# Patient Record
Sex: Female | Born: 1955 | Race: White | Hispanic: No | Smoking: Never smoker
Health system: Southern US, Community
[De-identification: ages and names within clinical notes are randomized; demographics above are authoritative.]

## PROBLEM LIST (undated history)

## (undated) DIAGNOSIS — M1990 Unspecified osteoarthritis, unspecified site: Secondary | ICD-10-CM

## (undated) DIAGNOSIS — T7840XA Allergy, unspecified, initial encounter: Secondary | ICD-10-CM

## (undated) DIAGNOSIS — I10 Essential (primary) hypertension: Secondary | ICD-10-CM

## (undated) HISTORY — PX: ROTATOR CUFF REPAIR: SHX139

## (undated) HISTORY — PX: BACK SURGERY: SHX140

## (undated) HISTORY — DX: Allergy, unspecified, initial encounter: T78.40XA

---

## 2000-08-17 ENCOUNTER — Encounter: Admission: RE | Admit: 2000-08-17 | Discharge: 2000-08-17 | Payer: Self-pay | Admitting: Family Medicine

## 2000-08-17 ENCOUNTER — Encounter: Payer: Self-pay | Admitting: Family Medicine

## 2000-08-30 ENCOUNTER — Encounter: Payer: Self-pay | Admitting: Neurosurgery

## 2000-09-01 ENCOUNTER — Encounter: Payer: Self-pay | Admitting: Neurosurgery

## 2000-09-01 ENCOUNTER — Inpatient Hospital Stay (HOSPITAL_COMMUNITY): Admission: RE | Admit: 2000-09-01 | Discharge: 2000-09-03 | Payer: Self-pay | Admitting: Neurosurgery

## 2000-09-22 ENCOUNTER — Encounter: Payer: Self-pay | Admitting: Neurosurgery

## 2000-09-22 ENCOUNTER — Encounter: Admission: RE | Admit: 2000-09-22 | Discharge: 2000-09-22 | Payer: Self-pay | Admitting: Neurosurgery

## 2000-10-23 ENCOUNTER — Encounter: Payer: Self-pay | Admitting: Neurosurgery

## 2000-10-23 ENCOUNTER — Encounter: Admission: RE | Admit: 2000-10-23 | Discharge: 2000-10-23 | Payer: Self-pay | Admitting: Neurosurgery

## 2001-05-01 ENCOUNTER — Encounter: Admission: RE | Admit: 2001-05-01 | Discharge: 2001-05-01 | Payer: Self-pay | Admitting: Neurosurgery

## 2001-05-01 ENCOUNTER — Encounter: Payer: Self-pay | Admitting: Neurosurgery

## 2003-06-24 ENCOUNTER — Encounter: Payer: Self-pay | Admitting: Neurosurgery

## 2003-06-24 ENCOUNTER — Encounter: Admission: RE | Admit: 2003-06-24 | Discharge: 2003-06-24 | Payer: Self-pay | Admitting: Neurosurgery

## 2003-07-30 ENCOUNTER — Encounter: Payer: Self-pay | Admitting: Neurosurgery

## 2003-08-01 ENCOUNTER — Encounter: Payer: Self-pay | Admitting: Neurosurgery

## 2003-08-01 ENCOUNTER — Inpatient Hospital Stay (HOSPITAL_COMMUNITY): Admission: RE | Admit: 2003-08-01 | Discharge: 2003-08-03 | Payer: Self-pay | Admitting: Neurosurgery

## 2004-02-23 ENCOUNTER — Ambulatory Visit (HOSPITAL_COMMUNITY): Admission: RE | Admit: 2004-02-23 | Discharge: 2004-02-23 | Payer: Self-pay | Admitting: Chiropractic Medicine

## 2004-07-17 IMAGING — CR DG CERVICAL SPINE COMPLETE 4+V
6 series · 6 of 6 positions shown · non-contrast
Comparison: none

CLINICAL DATA: right neck pain, back pain, prior back surgery
 LUMBAR SPINE FOUR VIEWS
 Five non-rib-bearing lumbar vertebrae.  Disc space narrowing L4-5.  Minimal endplate spur formation and scattered endplates.  No fracture or subluxation.  No bone destruction or spondylolysis seen.  Left assimilation joint L5-S1.  Hypoplastic last rib.
 IMPRESSION
 Minimal degenerative changes.  No evidence of acute injury.
 CERVICAL SPINE FIVE VIEWS
 Prior cervical fusion with anterior plate-and-screws extending from C5 to C7.  No fracture or subluxation.  Prevertebral soft tissues normal thickness.  Anterior spur formation C4-5.  Bony foramina patent.  C1-2 alignment normal.
 Prior C5-7 fusion.  Minimal degenerative changes without acute abnormality.

[view not recorded (1 of 6)]
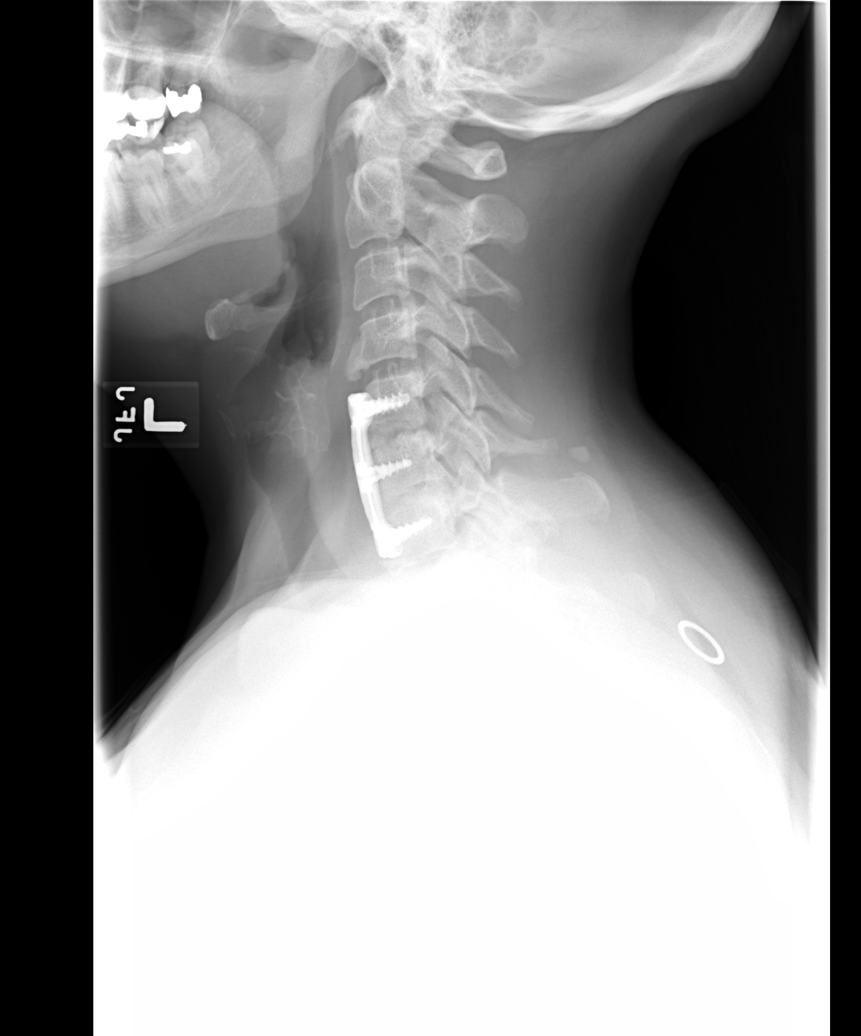

[view not recorded (2 of 6)]
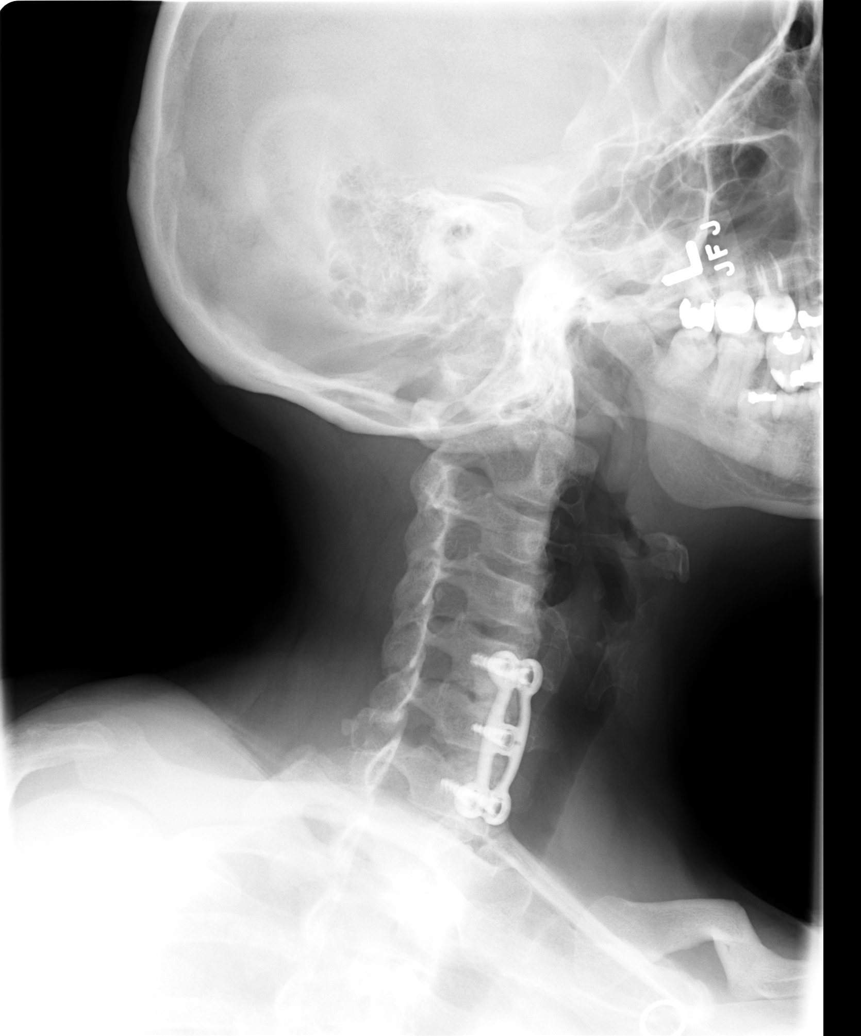

[view not recorded (3 of 6)]
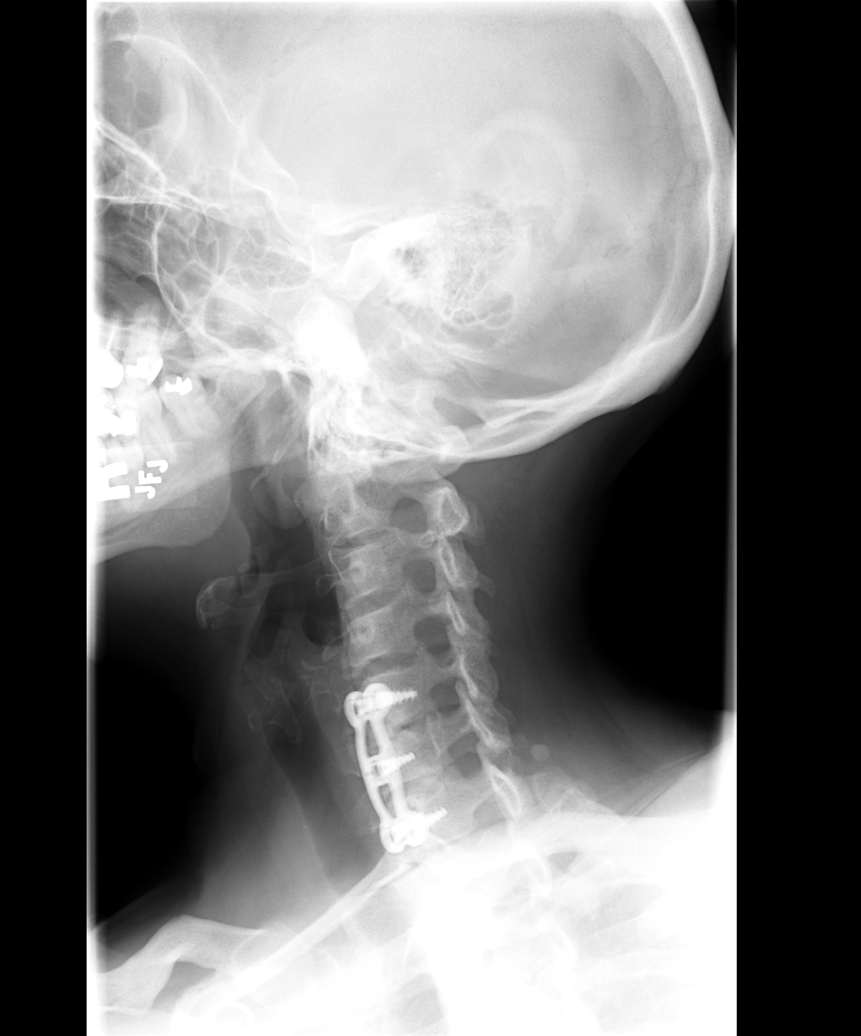

[view not recorded (4 of 6)]
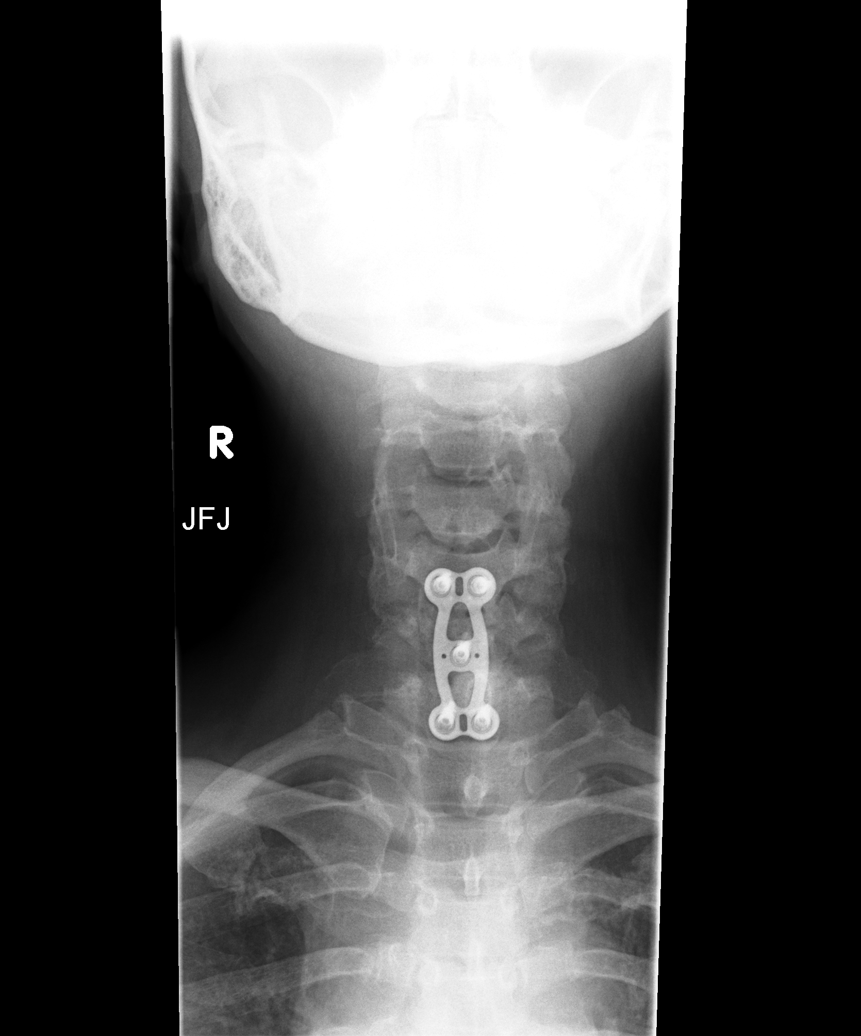

[view not recorded (5 of 6)]
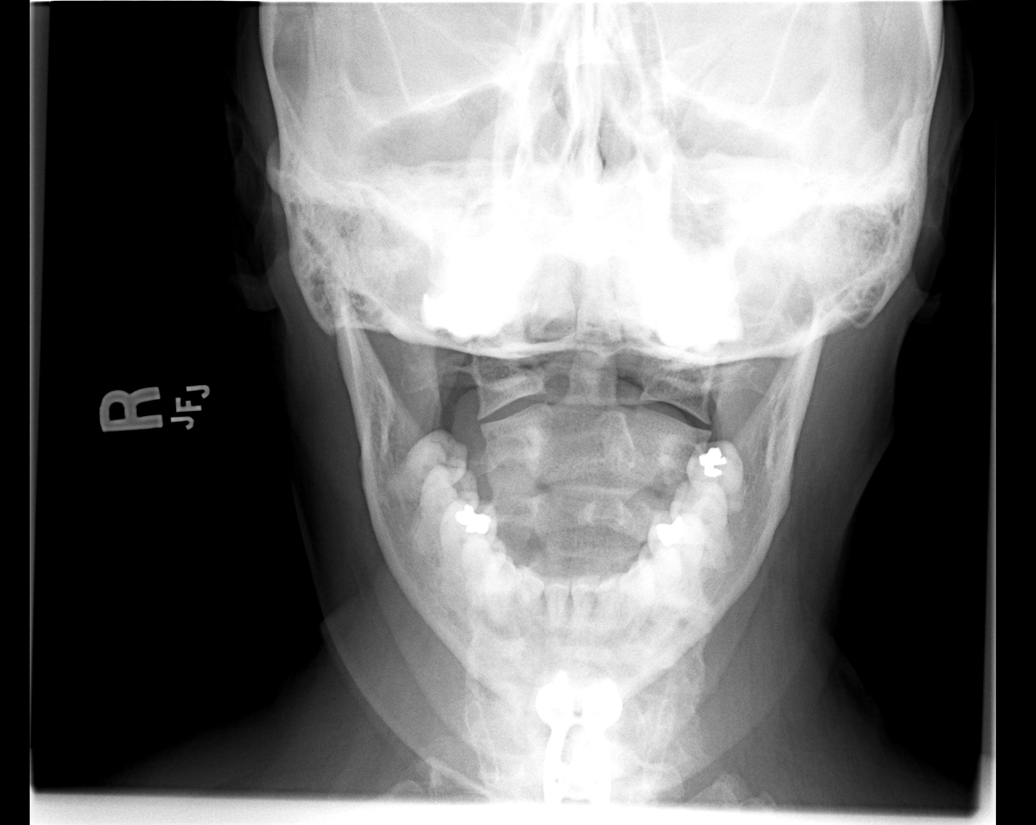

[view not recorded (6 of 6)]
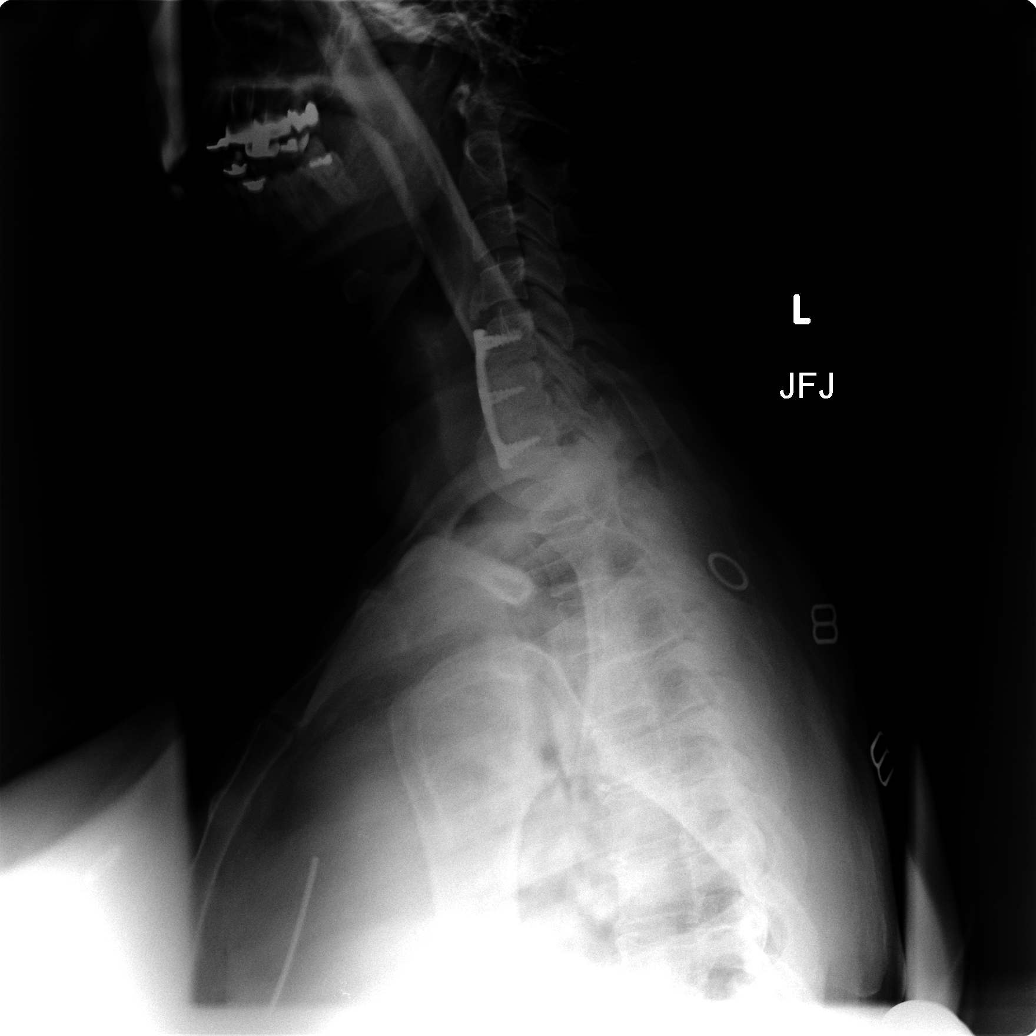

[6 of 6 positions shown; findings below may reference images not displayed]

## 2004-07-17 IMAGING — CR DG LUMBAR SPINE COMPLETE 4+V
6 series · 6 of 6 positions shown · non-contrast
Comparison: none

CLINICAL DATA: right neck pain, back pain, prior back surgery
 LUMBAR SPINE FOUR VIEWS
 Five non-rib-bearing lumbar vertebrae.  Disc space narrowing L4-5.  Minimal endplate spur formation and scattered endplates.  No fracture or subluxation.  No bone destruction or spondylolysis seen.  Left assimilation joint L5-S1.  Hypoplastic last rib.
 IMPRESSION
 Minimal degenerative changes.  No evidence of acute injury.
 CERVICAL SPINE FIVE VIEWS
 Prior cervical fusion with anterior plate-and-screws extending from C5 to C7.  No fracture or subluxation.  Prevertebral soft tissues normal thickness.  Anterior spur formation C4-5.  Bony foramina patent.  C1-2 alignment normal.
 Prior C5-7 fusion.  Minimal degenerative changes without acute abnormality.

[view not recorded (1 of 6)]
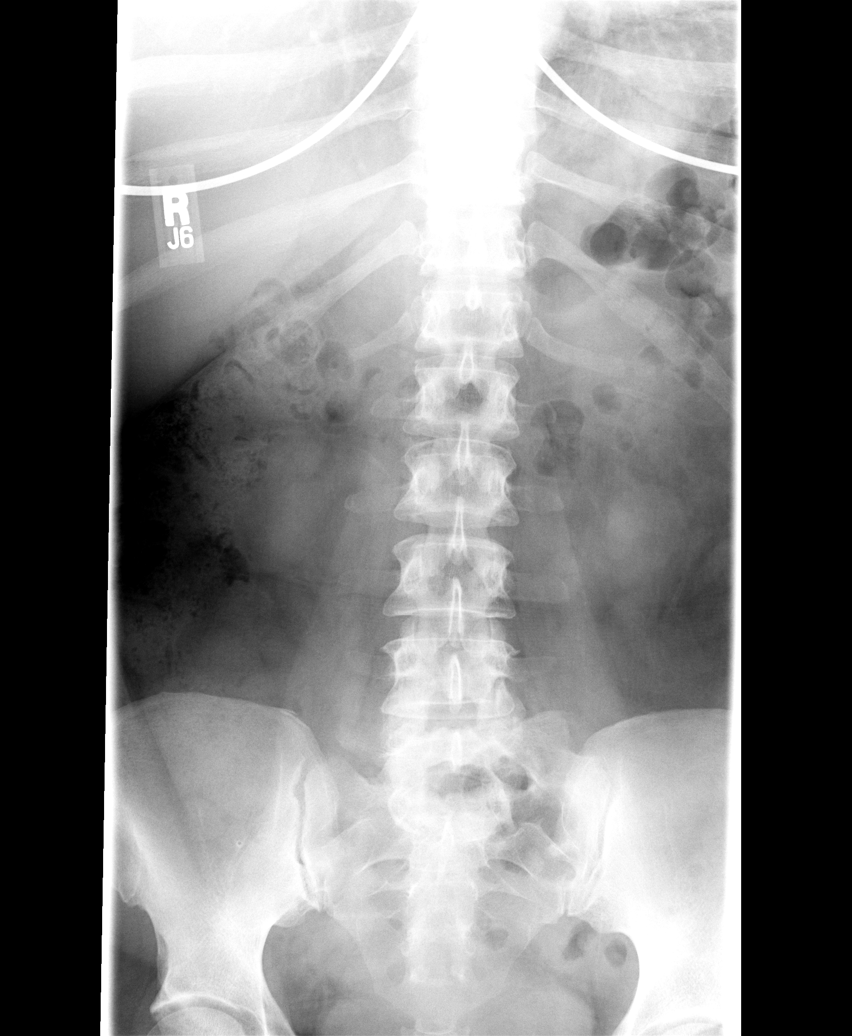

[view not recorded (2 of 6)]
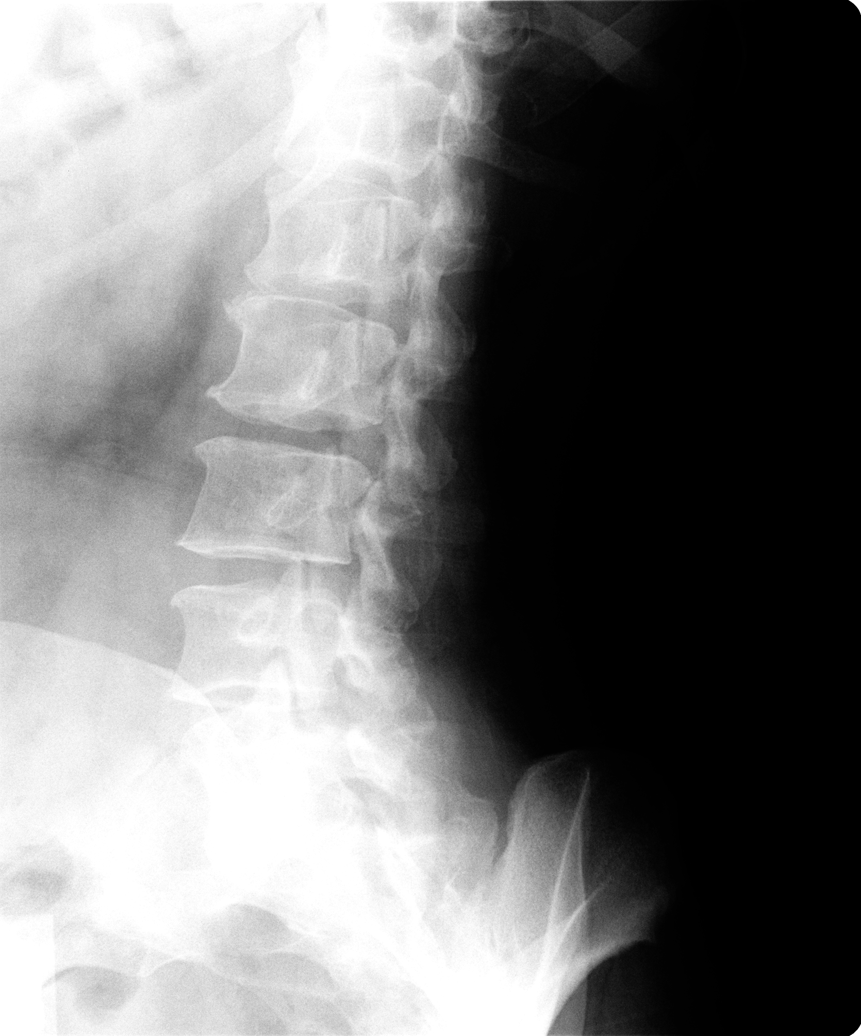

[view not recorded (3 of 6)]
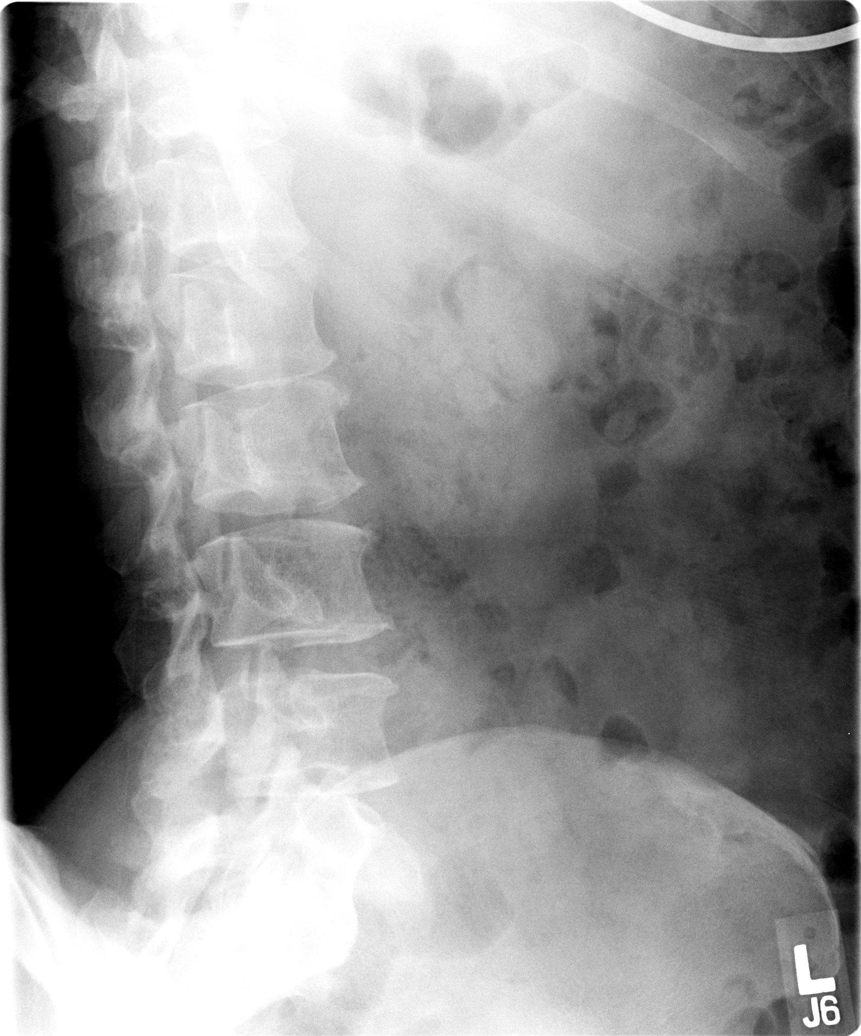

[view not recorded (4 of 6)]
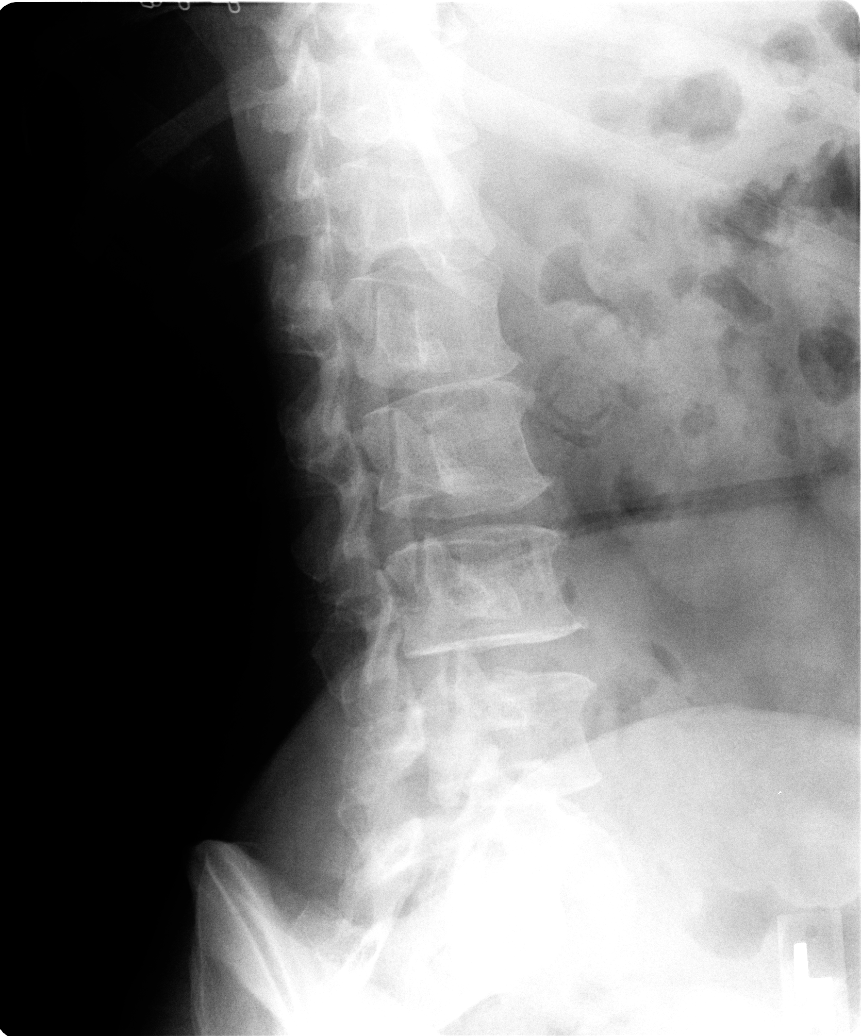

[view not recorded (5 of 6)]
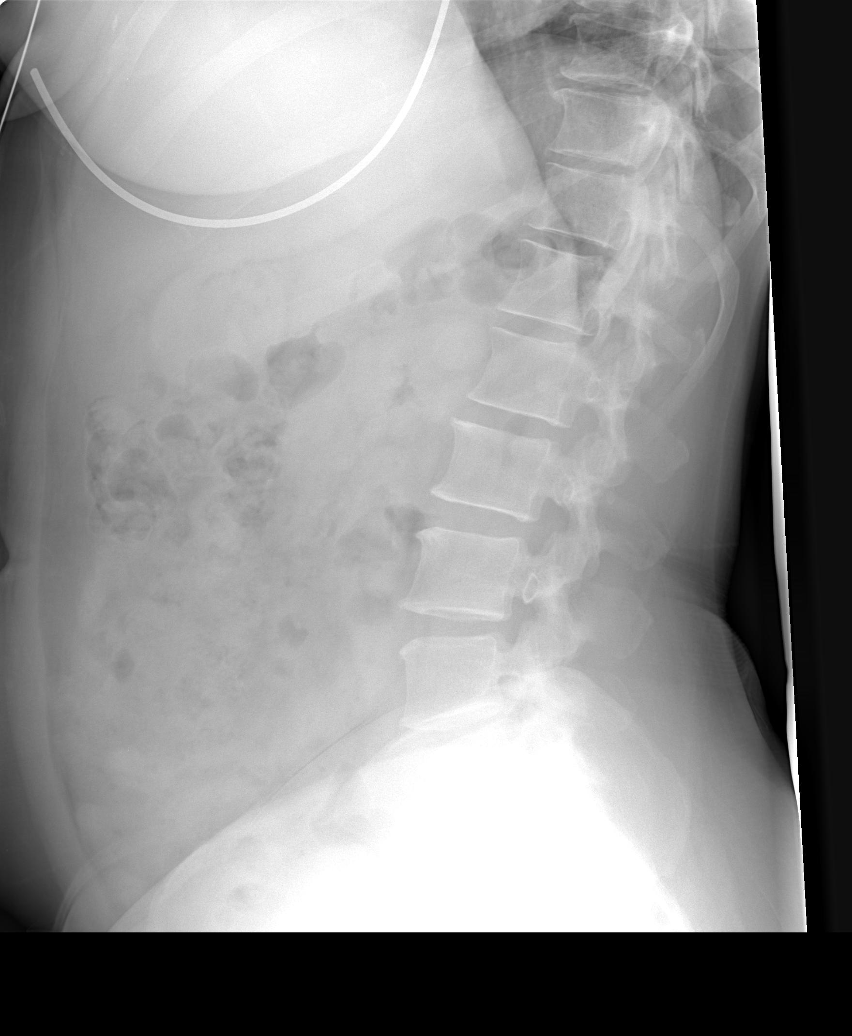

[view not recorded (6 of 6)]
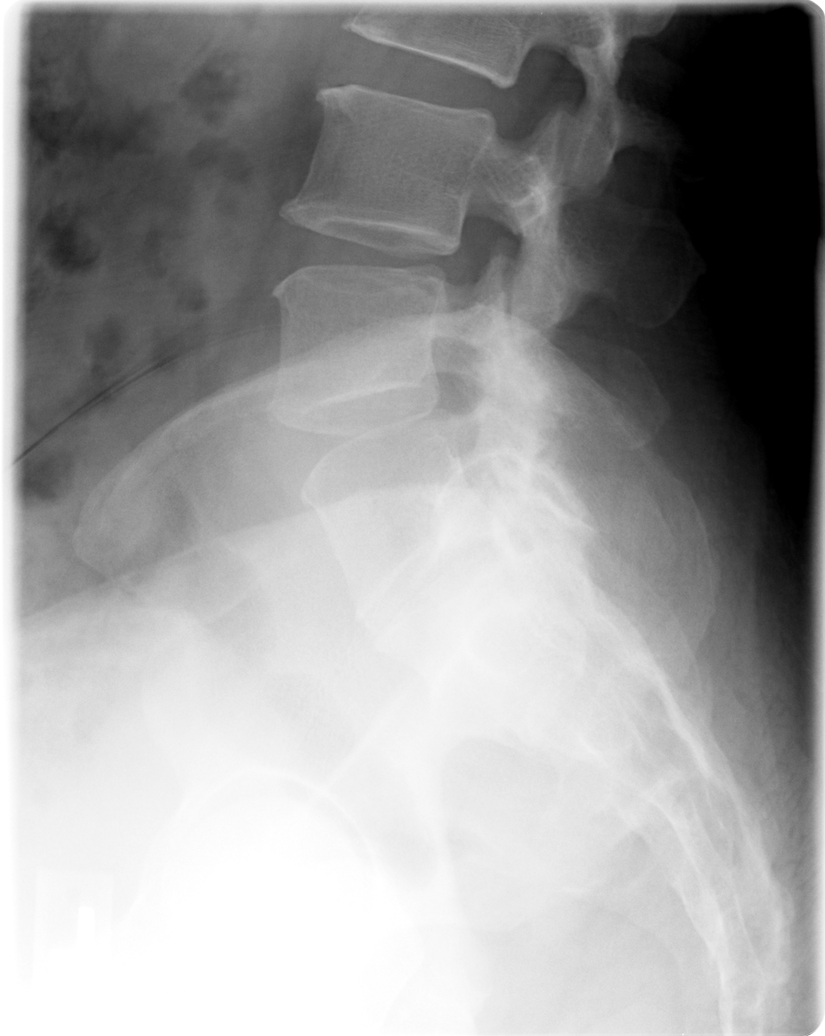

[6 of 6 positions shown; findings below may reference images not displayed]

## 2005-11-16 ENCOUNTER — Other Ambulatory Visit: Admission: RE | Admit: 2005-11-16 | Discharge: 2005-11-16 | Payer: Self-pay | Admitting: Family Medicine

## 2006-01-24 ENCOUNTER — Ambulatory Visit (HOSPITAL_BASED_OUTPATIENT_CLINIC_OR_DEPARTMENT_OTHER): Admission: RE | Admit: 2006-01-24 | Discharge: 2006-01-25 | Payer: Self-pay | Admitting: Orthopedic Surgery

## 2009-09-30 ENCOUNTER — Other Ambulatory Visit: Admission: RE | Admit: 2009-09-30 | Discharge: 2009-09-30 | Payer: Self-pay | Admitting: Family Medicine

## 2011-04-22 NOTE — H&P (Signed)
[place]  Patient:    Amanda Romero, Amanda Romero                    MRN: [id number] Adm. Date:  [id number] Attending:  Josie Saunders                         History and Physical  REASON FOR ADMISSION:  Herniated cervical disk with cervical radiculopathy.  HISTORY OF PRESENT ILLNESS:  The patient is a 55 year old woman without works in her fathers [employer], who has had pain over the last seven weeks in the right upper extremity, which is much worse over the last week to two weeks.  She had an MRI which demonstrated a herniated disc at C6-7 on the right.  She was sent to the office for further evaluation.  She has never had pain like this in the past.  She had chiropractic treatment, which did not relieve her pain.  She has no bowel or bladder dysfunction.  She has noted some weakness in the right upper extremity, although she feels that this may be related to pain.  PAST MEDICAL HISTORY: 1. Hypertension. 2. Peptic ulcer disease.  PAST SURGICAL HISTORY:  None.  ALLERGIES:  No known drug allergies.  CURRENT MEDICATIONS:  Zestril q.d. for hypertension.  FAMILY MEDICAL HISTORY:  Her mother is 49.  She has hypertension.  Her father is 3 in good health.  SOCIAL HISTORY:  She does not smoke, drink or use illicit drugs.  She is 4 feet 11 inches tall and weighs 150 pounds.  REVIEW OF SYSTEMS:  Negative.  She denies constitutional, eye, ear, nose, mouth, throat, cardiovascular, respiratory, genitourinary, gastrointestinal, skin, neurologic, psychiatric, endocrine, hematologic, or allergic problems.  PHYSICAL EXAMINATION:  VITAL SIGNS:  Pulse 128.  GENERAL:  Alert and oriented x 4, answering all questions appropriately.  She is mildly apprehensive.  NEUROLOGIC:  Cranial nerves II-XII normal.  She had 5/5 strength in the left upper extremity and both lower extremities.  She had 4/5 strength in the right triceps and wrist extensors.   Otherwise normal strength.  Deep tendon reflexes were 2+ in the biceps, brachioradialis and left triceps, 1+ in the right triceps, 2+ in the knees and ankles bilaterally.  No clonus was elicited.  No Hoffman signs were elicited.  She has down going great toes to plantar stimulation.  Proprioception and pinprick were normal in the upper and lower extremities.  NECK:  She has no cervical masses or bruits.  LUNGS:  Clear.  HEART:  Regular rate and rhythm.  Pulses were good at the wrists and feet bilaterally.  DIAGNOSTIC STUDIES:  MRI showed a herniated disk at C6-7 on the right, causing impingement on the right C7 nerve root.  She also had a small central disk herniation at C5-6, which was not impinging on the nerve root.  She has some degenerative changes at the C5-6 and C6-7 levels.  The spinal cord is normal on examination.  No abnormal masses of the paraspinous soft tissues.  DIAGNOSES; 1. Displaced disk at C6-7 right. 2. Cervical spondylosis at the C5-6 and C6-7 level with right C7    radiculopathy.  SUMMARY:  Dr. Franky Macho initially saw the patient and discussed several options with her including physical therapy and cervical traction.  She underwent traction without significant improvement.  She is having significant pain. She wanted a second opinion and wanted to see me and was consequently set  up to see me in the office.  I then saw her on September 20.  She had been initially expecting to see me and wished to have me for her care.  I reviewed her MRI and diagnostic studies and went over the examination with her.  I had a lengthy discussion with her and her mother.  She notes a six-month history of arm and neck pain but, also, over the last 1-1/2 weeks that she says the pain has become constant.  She has burning and numbness and says that all of the fingers go numb on the right.  She denies any left upper extremity complaints.  She notes some tingling in the front of her right  leg. Otherwise, she denies any significant lower extremity complaints.  She has positive Spurling maneuver on the right and negative on the left, negative over the ______ with axial compression.  She has significant weakness in her right upper extremity with biceps and triceps 4+/5.  The triceps is markedly weak at 4-/5.  Wrist flexion was weak at 4/5.  Intrinsic strength was full. Her biceps reflex and triceps reflex were somewhat diminished on the right compared to the left.  She notes numbness principally in the right first, second and third digits on the right to pinprick.  I reviewed her studies with her and her mother.  I have recommended, based on the severity of the degenerative changes as well as spondylitic disease and herniated cervical disk that she undergo anterior cervical discectomy and fusion of the C5-6 and C6-7 levels with allograft bone grafting and anterior cervical plating.  I discussed the attendant risks and potential benefits and typical postoperative course with her.  I discussed the risks of surgery which include but are not limited to the risks of anesthesia, blood loss, infection, injury to various neck structures including the trachea or esophagus which could cause either temporary or permanent swallowing difficulties and, also, the potential for perforation of the esophagus which might require operative intervention, recurrent laryngeal nerve which could cause either temporary over permanent vocal cord paralysis resulting in either temporary or permanent voice changes, injury to the cervical nerve roots which could cause either temporary or permanent arm pain, numbness and/or weakness.  There is a small chance of injury to the cervical spinal cord which could cause paralysis. There is also the potential for malplacement of instrumentation, fusion failure with the need for repeat surgery, degenerative disease at other levels of the neck, failure to relieve  pain and worsening of pain.  I have discussed with her that she will lose some neck mobility with surgery and she would typically stay overnight in the hospital after surgery.  DD:  09/01/00 TD:  09/02/00 Job: [id number]

## 2011-04-22 NOTE — Op Note (Signed)
[place]. [facility]  Patient:    Amanda Romero, Amanda Romero                    MRN: [id number] Proc. Date: 09/01/00 Adm. Date:  [id number] Disc. Date: [id number] Attending:  Josie Saunders                           Operative Report  PREOPERATIVE DIAGNOSES:  Herniated cervical disc with spondylosis, degenerative disk disease radiculopathy C5-C6 and C6-C7 levels.  POSTOPERATIVE DIAGNOSES:  Herniated cervical disc with spondylosis, degenerative disk disease radiculopathy C6-C7 and C6-7 levels.  OPERATION:  Anterior cervical discectomy and fusion, C5-C6 and C6-C7 levels with allograft bone graft in the anterior cervical plate.  SURGEON:  Danae Orleans. Venetia Maxon, M.D.  ASSISTANT:  Hewitt Shorts, M.D.  ANESTHESIA:  General endotracheal.  ESTIMATED BLOOD LOSS:  Less than 100 cc.  COMPLICATIONS:  None.  DISPOSITION:  To recovery.  INDICATIONS:  Amanda Romero is a 55 year old woman with severe right upper extremity pain with triceps and biceps weakness.  She has herniated disk of C5-C6 and C6-C7 levels.  It was elected to take her to surgery for anterior cervical diskectomy and fusion.  DESCRIPTION OF PROCEDURE:  Amanda Romero was brought to the operating room. Following the satisfactory and uncomplicated induction of general endotracheal anesthesia and placement of intravenous lines, she was placed in the supine position on the operating table.  Her neck was placed in slight extension and she was placed on 10 pounds of Holter traction.  Her anterior neck was then prepped and draped in the usual sterile fashion.  An area of the plain incision was infiltrated with 0.25% Marcaine and 0.5% lidocaine with 1:200,000 epinephrine.  Incision was made from the midline to the anterior border of the sternocleidomastoid muscle and carried sharply through the platysmal layer in the low cervical crease.  Subplatysmal dissection was performed and blunt dissection was  performed along the edge of the border of the sternocleidomastoid muscle keeping the carotid sheath lateral and the trachea and esophagus medial exposing the anterior cervical spine.  A spinal needle was placed at what was felt to be the C5-C6 level.  This was confirmed on x-ray visualization.  The longus coli muscles were then taken down at C5 through C7 bilaterally using electrocautery and Key elevator.  A self retaining Shadowline retractor was placed as well as ______ retractor to facilitate exposure.  The disk spaces at C5-C6 and C6-C7 levels were then incised with a 15 blade and disc material was removed in a piece meal fashion at both levels.  Disc space spreaders were placed and microscope was brought into the field using high powered visualization.  The Midas Rex drill with a 18 bur was then used to drill down the end plates of C5 and C6 and decorticate the end plates and remove ______ spurs at each of these levels.  There is a central to right-sided disk herniation at the C5-6 level.  The central cord dura and the nerve roots were decompressed.  Hemostasis was obtained with Gelfoam soaked in Thrombin.  A iliac crest tricortical graft was then cut to a depth of 12 mm of thickness and 7 mm was inserted into the disk space and countersunk appropriately.  Attention was then turned to the C6-7 level where a similar decompression was performed with a large free fragment of disk herniation directly overlying the C7 nerve root which was decompressed  on the right.  The left C6 nerve root was similarly decompressed as was the central spinal cord dura.  The similarly sized bone graft was placed after decorticating the end plates of C6 and C7 with Midas Rex drill.  Again, hemostasis was obtained with Gelfoam soaked in thrombin.  A anterior cervical tether plate was then affixed to the anterior cervical spine with a central screw at C6, and 2 screws at C5 and 2 screws at C7. Variable  angle 11 mm screws were used.  All screws all had excellent purchase.  They were tightened.  The plate was lordosed prior to affixing the anterior cervical spine.  A 32 mm plate was used.  Final x-ray confirmed positioning of bone grafts and anterior cervical plate and was copiously irrigated with bacitracin and saline and there was excellent hemostasis.  Soft tissues were inspected and found to be in good repair.  The platysma layer was reapproximated with 3-0 Vicryl sutures.  The skin edges were reapproximated with running 4-0 Vicryl subcuticular stitch. The wound was dressed with Benzoin, Steri-Strips, Telfa gauze and tape.  The patient was extubated in the operating room and taken to the recovery room in stable satisfactory condition having tolerated the operation well.  Counts were correct at the end of the case. DD:  09/01/00 TD:  09/02/00 Job: [id number]

## 2011-04-22 NOTE — H&P (Signed)
[place]. [facility]  Patient:    Amanda, Romero                    MRN: [id number] Adm. Date:  [id number] Attending:  Josie Saunders                         History and Physical  CHIEF COMPLAINT:  Herniated cervical disk, cervical radiculopathy.  HISTORY OF PRESENT ILLNESS:  Ms. 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  She had an MRI which demonstrated a herniated disk at C6-7 on the right.  She was sent to the office for further evaluation.  She has never had pain like this in the past.  She had chiropractic treatment which did not relieve her pain.  She has no bowel or bladder dysfunction.  She has noted some weakness in her right upper extremity.  She feels this may be related to pain.  PAST MEDICAL HISTORY: 1. Hypertension. 2. Peptic ulcer disease.  PAST SURGICAL HISTORY:  None.  ALLERGIES:  No known drug allergies.  CURRENT MEDICATIONS:  Zestril once q.d. for hypertension.  FAMILY HISTORY:  Mother is age 53.  She has hypertension.  Father is age 96, in good health.  SOCIAL HISTORY:  She does not smoke, drink, or use illicit drugs.  She is 4 feet 11 inches tall, weighs 150 pounds.  REVIEW OF SYSTEMS:  Negative.  She denies constitutional, eyes, ears, mouth, throat, cardiovascular, respiratory, genitourinary, gastrointestinal, skin, neurologic, psychiatric, endocrine, hematology, or allergic problems.  PHYSICAL EXAMINATION:  VITAL SIGNS:  Pulse 128.  GENERAL:  The patient is alert and oriented x 4, answering all questions appropriately.  She was mildly apprehensive.  She is well-kempt.  NEUROLOGIC:  Cranial nerves II-XII normal.  She had 5/5 strength in the left upper extremity, in both lower extremities.  She had 4/5 strength in the right triceps and wrist  extensors, otherwise normal strength.  Deep tendon reflexes 2+ in the biceps, brachial radialis, left triceps, and 1+ in the right triceps, 2+ in the knees and ankles bilaterally.  No clonus was elicited.  No Hoffmanns signs were elicited.  She had downgoing great toes to plantar stimulation.  Proprioception and pinprick were normal in the upper and lower extremities.   She had no cervical masses or bruits.  LUNGS:  Fields were clear.  HEART:  Regular rate and rhythm.  Pulses good in the wrists and feet bilaterally.  DIAGNOSTIC STUDIES:  MRI showed a herniated disk at C6-7 on the right, causing impingement on the right C7 nerve root.  She also has a small central disk herniation at C5-6 which was not impinging on the nerve root.  She has some degenerative changes at the C5-6 and C6-7 levels.  The spinal cord is normal on examination.  No abnormal masses in the paraspinous soft tissues.  ADMISSION DIAGNOSES: 1. Displaced disk C6-7 right. 2. Cervical spondylosis C5-6 and C6-7 levels with right C7 radiculopathy.  Dr. Ronaldo Miyamoto L. Cabbell initially saw the patient and discussed several options with her, including physical therapy and cervical traction.  She underwent traction without significant improvement.  She is having significant pain. She wanted a second opinion and wanted to see myself, and was consequently set up to see myself  in the office.  I then saw her on August 24, 2000.  She had been initially expecting to see me, and wished to have me for her care.  I reviewed her MRI and diagnostic studies, and went over the examination with her, and I had a lengthy discussion with her and with her mother.  She notes a six-month history of neck and arm pain, but also over the last 1-1/2 weeks she says that the pain has become constant, and she has burning and numbness, and says that all of her fingers go numb on the right.  She denies any left upper extremity complaints.  She notes some  tingling in the front of her right leg, otherwise denies any significant lower extremity complaints.  She has a positive Spurlings maneuver on the right and negative on the left.  Negative Lhermittes sign with axial compression.  She had significant weakness in her right upper extremity with biceps 4+/5, and triceps was markedly weak at 4-/5. Wrist flexion was weak at 4/5.  Hand intrinsic strength was full.  Her biceps reflex and triceps reflex were somewhat diminished on the right compared to the left.  She notes numbness principally in her right first, second, and third digits on the right to pinprick.  RECOMMENDATIONS:  I have reviewed her studies with her and with her other.  I have recommended, based on the severity of her degenerative changes, as well as spondylitic disease and herniated cervical disk, that she undergo an anterior cervical diskectomy and fusion at the C5-6 and C6-7 levels, with allograft bone grafting and anterior cervical plating.  RISKS/BENEFITS:  I discussed the attendant risks and potential benefits, the typical operative and postoperative course with her.  I discussed the risks of the surgery, which include but are not limited to, risks of anesthesia, blood loss, infection, injury to various neck structures including the trachea, esophagus which could cause either temporary or permanent swallowing difficulties, and also the potential for perforation of the esophagus, which might require an operative intervention, larynx, recurrent laryngeal nerve, nerve root, which could cause either temporary or permanent vocal cord paralysis, resulting in either temporary or permanent voice changes, injury to the cervical nerve roots which could cause either temporary or permanent arm pain, numbness and/or weakness.  There is a small chance of injury to the cervical spinal cord which could cause paralysis.  There is also the potential for malplacement of instrumentation,  fusion failure, need for repeat surgery, degenerative disease at other levels in the neck, failure to relieve pain, worsening of pain.  I also discussed with her that she will lose some neck  mobility with surgery, and that she will typically stay overnight in the hospital after the surgery. DD:  09/01/00 TD:  09/01/00 Job: [id number]

## 2011-04-22 NOTE — Op Note (Signed)
NAME:  Amanda Romero, Amanda Romero                       ACCOUNT NO.:  [id number]   MEDICAL RECORD NO.:  [id number]                   PATIENT TYPE:  OIB   LOCATION:  NA                                   FACILITY:  [facility]   PHYSICIAN:  Danae Orleans. Venetia Maxon, M.D.               DATE OF BIRTH:  12-13-1955   DATE OF PROCEDURE:  08/01/2003  DATE OF DISCHARGE:                                 OPERATIVE REPORT   PREOPERATIVE DIAGNOSIS:  Lumbar spinal stenosis, L4-L5 with spondylosis,  degenerative disc disease, and lumbar radiculopathy.   POSTOPERATIVE DIAGNOSIS:  Lumbar spinal stenosis, L4-L5 with spondylosis,  degenerative disc disease, and lumbar radiculopathy.   PROCEDURE:  Bilateral laminotomies L4-L5 with microdissection.   SURGEON:  Danae Orleans. Venetia Maxon, M.D.   ASSISTANT:  Payton Doughty, M.D.   ANESTHESIA:  General endotracheal anesthesia.   ESTIMATED BLOOD LOSS:  Minimal.   COMPLICATIONS:  None.   DISPOSITION:  Recovery room.   INDICATIONS FOR PROCEDURE:  Amanda Romero is a 55 year old woman with  severe lateral recess stenosis at L4-L5 with right L5 radiculopathy.  She  has hypertrophied ligamentum flavum and facet arthropathy.  She does not  have spondylolisthesis.  It was elected to take her to surgery for  decompressive laminotomies at the L4-L5 level.   PROCEDURE:  Amanda Romero was brought to the operating room.  Following  satisfactory uncomplicated induction of general endotracheal anesthesia and  placement of intravenous  lines, the patient was placed in a prone position  on the Wilson frame.  Her low back was then prepped and draped in the usual  sterile fashion.  The area of planned incision was infiltrated with 0.25%  Marcaine and 0.5% lidocaine with 1:200,000 epinephrine.  An incision was  made in the midline overlying the L4 through L5 spinous processes, and  carried through copious adipost tissue to the lumbodorsal fascia which was  incised bilaterally exposing the L4-L5  interspace.  An interoperative x-ray  confirmed the correct level with a marker at the L4-L5 interspace.  A self-  retaining retractor was placed.  Using the Anspach drill and bit,  hemilaminectomy of L4 was performed bilaterally.  This was then completed  with Kerrison rongeurs.  Hypertrophied ligamentum flavum was then removed in  a piecemeal fashion and lateral recess was decompressed.  The left lateral  recess was quite snug and was compressing the traversing L5 nerve root and  the right sided lateral recess was even more tight with significant L5 nerve  root compression.  The microscope was used to perform microdissection and  using microdissection technique, the L5 nerve root was mobilized bilaterally  and the lateral recess was decompressed as was the cephalad extent of the  hypertrophied ligamentum flavum.  Foraminotomies were performed overlying  both L5 nerve roots.  The edge of the pedicle was felt on the right side and  it was felt that the nerve root was  well decompressed as it coursed out the  neural foramen.  Hemostasis was assured.  The wound was copiously irrigated  with Bacitracin saline.  The retractor was removed, the microscope was taken  off the field.  The lumbodorsal fascia was closed with 0 Vicryl sutures, the  subcutaneous tissues were reapproximated with 2-0 Vicryl interrupted  inverted sutures, and the skin edges were reapproximated with interrupted 3-  0 Vicryl subcuticular stitch.  The wound was dressed with Dermabond.  The  patient was extubated in the operating room and taken to the recovery room  in stable condition having tolerated the operation well.  Counts were  correct at the end of the case.                                               Danae Orleans. Venetia Maxon, M.D.    JDS/MEDQ  D:  08/01/2003  T:  08/01/2003  Job:  [id number]

## 2011-04-22 NOTE — Op Note (Signed)
[name], Amanda Romero             ACCOUNT NO.:  [id number]   MEDICAL RECORD NO.:  [id number]          PATIENT TYPE:  AMB   LOCATION:  DSC                          FACILITY:  [facility]   PHYSICIAN:  Katy Fitch. Sypher, M.D. 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  Identification  of complete retracted rotator cuff tear of the supraspinatus and significant  tendinopathy undermining and partial-thickness joint side degenerative  tearing of the infraspinatus.   OPERATION:  1.  Diagnostic arthroscopy right glenohumeral joint.  2.  Arthroscopic debridement of deep surface rotator cuff tear and      debridement of degenerative labral changes from 3 o'clock anteriorly to      9 o'clock posteriorly.  3.  Arthroscopic subacromial decompression with bursectomy and      coracoacromial ligament release.  4.  Open resection distal clavicle.  5.  Open repair of infraspinatus and supraspinatus rotator cuff tendons.   OPERATING SURGEON:  Molly Maduro Sypher   ASSISTANT:  Molly Maduro Dasnoit PA-C.   ANESTHESIA:  General by endotracheal technique, supervising anesthesiologist  is Dr. Isidor Holts.   INDICATIONS:  ELLOISE Romero is a 55 year old woman referred through the  courtesy of Dr. Kerri Perches, neurosurgeon, for evaluation and management of a  painful right shoulder with an MRI proven rotator cuff tear.   Amanda Romero has had a history of cervical spine degenerative disk disease  and has been  a active patient of Dr. Venetia Maxon.   During a recent assessment of right shoulder pain, Dr. Venetia Maxon repeated MRIs  of Amanda Romero's neck and shoulder. The shoulder MRI documented a  significantly retracted right supraspinatus rotator cuff tear. She is also  noted to have significant AC degenerative change and degenerative labral  changes and biceps tendinopathy.   Ms. Sample was referred for an upper extremity orthopedic consult.  Clinical examination confirmed weakness of abduction and external rotation  and positive impingement signs.   We recommended proceeding with reconstruction of her right rotator cuff,  anticipating preoperative joint debridement, subacromial decompression and  distal clavicle resection.   After informed consent, she is brought to the operating room at this time.   PROCEDURE:  Amanda Romero is brought to the operating room and placed in  supine position on the table. Following an informed consent by Dr.  Gelene Mink, an interscalene block was placed on the right with excellent  results.   Amanda Romero was transferred to room 1, placed in supine position on the  table and after proper site identification protocol and time-out general  endotracheal anesthesia induced under Dr. Thornton Dales supervision.   Amanda Romero was positioned in beach-chair position with aid of a torso  and  head holder designed for shoulder arthroscopy. The entire right shoulder and  forequarter were prepped with DuraPrep and draped with impervious  arthroscopy drapes.   Examination of the shoulder under anesthesia revealed a stable glenohumeral  joint. There is no sign of adhesive capsulitis.   The joint was distended with 20 mL of sterile saline brought in with an  anterior spinal needle approach followed by placement of the scope through a  standard posterior viewing portal. Diagnostic arthroscopy confirmed the  rotator cuff tear, moderate synovitis anteriorly and considerable   degenerative changes of labrum. The labrum was essentially absent from 12  o'clock superiorly to about 9 o'clock posteriorly.  Inferior labrum was  intact.   An anterior portal was created and a 4.5-mm suction shaver was used to  debride the labrum of all unstable fragments and to debride the deep surface  of the rotator cuff tear.   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 We then proceeded to the subacromial space. A florid bursitis was  noted and debrided with the suction shaver brought in laterally. [facility]  joint was identified and found to have unfavorable anatomy for arthroscopic  resection. Therefore after hemostasis of the deep vessels on the  undersurface of the coracoacromial ligament and the AC joint capsule, the  arthroscopic probe was removed and we proceeded to a open rotator cuff  reconstruction.   A 4 cm incision was fashioned from the [facility] joint across the anterior  acromion. The anterior and middle thirds of deltoid were split over a  distance of 2 cm and the anterior third of deltoid elevated off the anterior  acromion.   The acromion was leveled to a type 1 morphology with a oscillating saw and  power bur followed by a bursal debridement. The distal 15 mm clavicle was  exposed subperiosteally revealing a rather significant hypertrophic  osteophytes at the [facility] joint. After baby Bennett retractors were placed, the  distal 15 mm clavicle was removed with the oscillating saw. Palpation  revealed a satisfactory resection without significant residual impingement  of the distal clavicle.   The subacromial space was debrided and the bursa released with digital  dissection. The rotator cuff tear was freshened with use of a 15 scalpel  blade triangulating the retracted supraspinatus tear.  A baseball type stitch of #2 FiberWire was used to gather the infraspinatus   and supraspinatus and advance it laterally. Two 5.5 mm Biocorkscrew anchors  were placed at the medial footprint. The greater tuberosity was decorticated  with a power bur and its profile lowered approximately 3 mm. The  infraspinatus was repaired with two mattress sutures of #2 FiberWire  anatomically to the decorticated tuberosity and the supraspinatus was  repaired at its medial footprint with two mattress sutures of #2 FiberWire.  The baseball stitch was then used as grasping suture and used to advance the  supraspinatus to anatomic insertion laterally with through bone McLaughlin  sutures.   The repair was finished with a running suture of 0 Vicryl.   After thorough irrigation of the subacromial space using the pump, the  anterior third of the deltoid was repaired to the trapezius muscle, closing  dead space at the site of the distal clavicle resection and the anterior  third of the deltoid was repaired to the acromion and fascia of the deltoid  muscle. The muscle split laterally was repaired with simple  suture of 0  Vicryl.   There no apparent complications.   Ms. Hindes tolerated surgery and anesthesia well. She was transferred  recovery room stable vital signs.   She will be admitted to recovery care center for observation of vital signs  and prophylactic antibiotics in the form of Ancef 1 gram IV q.8 h. X 24  hours. We expect use of p.o. and IV Dilaudid as well as p.o. Percocet p.r.n.  pain.      Katy Fitch Sypher, M.D.  Electronically Signed     RVS/MEDQ  D:  01/24/2006  T:  01/25/2006  Job:  [id number]

## 2020-04-02 DIAGNOSIS — I10 Essential (primary) hypertension: Secondary | ICD-10-CM | POA: Diagnosis not present

## 2020-04-02 DIAGNOSIS — R42 Dizziness and giddiness: Secondary | ICD-10-CM | POA: Diagnosis not present

## 2020-04-02 DIAGNOSIS — R4182 Altered mental status, unspecified: Secondary | ICD-10-CM | POA: Diagnosis not present

## 2020-04-02 DIAGNOSIS — N390 Urinary tract infection, site not specified: Secondary | ICD-10-CM | POA: Diagnosis not present

## 2020-04-02 DIAGNOSIS — R300 Dysuria: Secondary | ICD-10-CM | POA: Diagnosis not present

## 2022-02-06 ENCOUNTER — Encounter: Payer: Self-pay | Admitting: Emergency Medicine

## 2022-02-06 ENCOUNTER — Observation Stay
Admission: EM | Admit: 2022-02-06 | Discharge: 2022-02-08 | Disposition: A | Discharge status: Home / Self Care | Payer: PPO | Attending: Family Medicine | Admitting: Family Medicine

## 2022-02-06 ENCOUNTER — Emergency Department: Payer: PPO

## 2022-02-06 DIAGNOSIS — G459 Transient cerebral ischemic attack, unspecified: Principal | ICD-10-CM | POA: Insufficient documentation

## 2022-02-06 DIAGNOSIS — Z20822 Contact with and (suspected) exposure to covid-19: Secondary | ICD-10-CM | POA: Insufficient documentation

## 2022-02-06 DIAGNOSIS — R29898 Other symptoms and signs involving the musculoskeletal system: Secondary | ICD-10-CM | POA: Diagnosis not present

## 2022-02-06 DIAGNOSIS — Z79899 Other long term (current) drug therapy: Secondary | ICD-10-CM | POA: Insufficient documentation

## 2022-02-06 DIAGNOSIS — I10 Essential (primary) hypertension: Secondary | ICD-10-CM | POA: Insufficient documentation

## 2022-02-06 DIAGNOSIS — E876 Hypokalemia: Secondary | ICD-10-CM | POA: Diagnosis not present

## 2022-02-06 DIAGNOSIS — R4182 Altered mental status, unspecified: Secondary | ICD-10-CM | POA: Diagnosis present

## 2022-02-06 HISTORY — DX: Unspecified osteoarthritis, unspecified site: M19.90

## 2022-02-06 HISTORY — DX: Essential (primary) hypertension: I10

## 2022-02-06 LAB — DIFFERENTIAL
Abs Immature Granulocytes: 0.05 10*3/uL (ref 0.00–0.07)
Basophils Absolute: 0.1 10*3/uL (ref 0.0–0.1)
Basophils Relative: 1 %
Eosinophils Absolute: 0.2 10*3/uL (ref 0.0–0.5)
Eosinophils Relative: 2 %
Immature Granulocytes: 1 %
Lymphocytes Relative: 21 %
Lymphs Abs: 1.8 10*3/uL (ref 0.7–4.0)
Monocytes Absolute: 0.6 10*3/uL (ref 0.1–1.0)
Monocytes Relative: 7 %
Neutro Abs: 6.2 10*3/uL (ref 1.7–7.7)
Neutrophils Relative %: 68 %

## 2022-02-06 LAB — CBG MONITORING, ED: Glucose-Capillary: 126 mg/dL — ABNORMAL HIGH (ref 70–99)

## 2022-02-06 LAB — APTT: aPTT: 29 seconds (ref 24–36)

## 2022-02-06 LAB — CBC
HCT: 46.2 % — ABNORMAL HIGH (ref 36.0–46.0)
Hemoglobin: 15 g/dL (ref 12.0–15.0)
MCH: 29.1 pg (ref 26.0–34.0)
MCHC: 32.5 g/dL (ref 30.0–36.0)
MCV: 89.7 fL (ref 80.0–100.0)
Platelets: 314 10*3/uL (ref 150–400)
RBC: 5.15 MIL/uL — ABNORMAL HIGH (ref 3.87–5.11)
RDW: 13.3 % (ref 11.5–15.5)
WBC: 8.9 10*3/uL (ref 4.0–10.5)
nRBC: 0 % (ref 0.0–0.2)

## 2022-02-06 LAB — COMPREHENSIVE METABOLIC PANEL
ALT: 17 U/L (ref 0–44)
AST: 25 U/L (ref 15–41)
Albumin: 4 g/dL (ref 3.5–5.0)
Alkaline Phosphatase: 42 U/L (ref 38–126)
Anion gap: 11 (ref 5–15)
BUN: 23 mg/dL (ref 8–23)
CO2: 26 mmol/L (ref 22–32)
Calcium: 9.6 mg/dL (ref 8.9–10.3)
Chloride: 105 mmol/L (ref 98–111)
Creatinine, Ser: 1.16 mg/dL — ABNORMAL HIGH (ref 0.44–1.00)
GFR, Estimated: 52 mL/min — ABNORMAL LOW (ref >60)
Glucose, Bld: 106 mg/dL — ABNORMAL HIGH (ref 70–99)
Potassium: 3.3 mmol/L — ABNORMAL LOW (ref 3.5–5.1)
Sodium: 142 mmol/L (ref 135–145)
Total Bilirubin: 0.6 mg/dL (ref 0.3–1.2)
Total Protein: 7.7 g/dL (ref 6.5–8.1)

## 2022-02-06 LAB — PROTIME-INR
INR: 1 (ref 0.8–1.2)
Prothrombin Time: 12.8 seconds (ref 11.4–15.2)

## 2022-02-06 MED ORDER — POTASSIUM CHLORIDE CRYS ER 20 MEQ PO TBCR
40.0000 meq | EXTENDED_RELEASE_TABLET | Freq: Once | ORAL | Status: AC
  Administered 2022-02-07: 40 meq via ORAL
  Filled 2022-02-06: qty 2

## 2022-02-06 MED ORDER — CLOPIDOGREL BISULFATE 75 MG PO TABS
300.0000 mg | ORAL_TABLET | Freq: Once | ORAL | Status: AC
  Administered 2022-02-07: 300 mg via ORAL
  Filled 2022-02-06: qty 4

## 2022-02-06 MED ORDER — ASPIRIN 81 MG PO CHEW
324.0000 mg | CHEWABLE_TABLET | Freq: Once | ORAL | Status: AC
Start: 2022-02-06 — End: 2022-02-07
  Administered 2022-02-07: 324 mg via ORAL
  Filled 2022-02-06: qty 4

## 2022-02-06 NOTE — Consult Note (Signed)
[facility] ?[facility] Tele[facility]urology Consult Services ? ? ?Patient Name:   Amanda Romero, Amanda Romero ?Date of Birth:   [birth year] ?Identification Number:   MRN - [id number] ?Date of Service:   02/06/2022 22:57:53 ? ?Diagnosis: ?      G45.9 - Transient cerebral ischemic attack, unspecified ? ?Impression: ?     66yoF hx of HTN present with transient confusion and slurred speech. CTH neg for acute process. NIHSS 0 on exam and ambulate independently. Not thrombolytics candidate, no disabling deficit. ABCD2 score of 5. ?  ? Recommend admission for TIA workup, start DAPT for 21 days followed by aspirin 81mg  daily, metabolic/infection screening, permissive HTN for 24hr. ? ?Our recommendations are outlined below. ? ?Recommendations: ? ?      Stroke/Telemetry Floor ?      Neuro Checks ?      Bedside Swallow Eval ?      DVT Prophylaxis ?      IV Fluids, Normal Saline ?      Head of Bed 30 Degrees ?      Euglycemia and Avoid Hyperthermia (PRN Acetaminophen) ?      Initiate dual antiplatelet therapy with Aspirin 81 mg daily and Clopidogrel 75 mg daily. ?      Antihypertensives PRN if Blood pressure is greater than 220/120 or there is a concern for End organ damage/contraindications for permissive HTN. If blood pressure is greater than 220/120 give labetalol PO or IV or Vasotec IV with a goal of 15% reduction in BP during the first 24 hours. ? ?Routine Consultation with [facility] for Follow up Care ? ?Sign Out: ?      Discussed with Emergency Department Provider ? ? ? ?------------------------------------------------------------------------------ ? ?Advanced Imaging: ?Advanced Imaging Not Completed because: ? ?exam not suggestive of LVO ? ? ?Metrics: ?Last Known Well: 02/06/2022 19:30:00 ?[facility] Notification Time: 02/06/2022 22:57:53 ?Arrival Time: 02/06/2022 22:36:00 ?Stamp Time: 02/06/2022 22:57:53 ?Initial Response Time: 02/06/2022 23:00:26 ?Symptoms: transient confusion and slurred speech . ?NIHSS Start  Assessment Time: 02/06/2022 23:02:14 ?Patient is not a candidate for Thrombolytic. ?Thrombolytic Medical Decision: 02/06/2022 23:05:36 ?Patient was not deemed candidate for Thrombolytic because of following reasons: ?No disabling symptoms. ? ?CT head showed no acute hemorrhage or acute core infarct. ? ?ED Physician notified of diagnostic impression and management plan on 02/06/2022 23:18:39 ? ? ? ?------------------------------------------------------------------------------ ? ?History of Present Illness: ?Patient is a 66 year old Female. ? ?Patient was brought by private transportation with symptoms of transient confusion and slurred speech . ?Family last saw patient around 7:30PM. When family came back around 8:30pm and found patient confused, not able to communicate, and slurring her words. EMS was called and by the time EMS arrived patient's symptom has resolved. Daughter decide to bring patient in for evaluation because her speech was not back to baseline. At time of neuro eval, patient appear close to baseline, confirmed by daughter. ? ?Patient reports she was messing with daughter but daughter reports she thinks the symptoms were real. ? ?Patient had similar episode 1 year ago due to UTI and had stroke workup done at that time as well. ? ? ? ?Past Medical History: ?     Hypertension ? ?Medications: ? ?No Anticoagulant use  ?No Antiplatelet use ?Reviewed EMR for current medications ? ?Allergies:  ?Reviewed,NKDA ? ?Social History: ?Alcohol Use: Yes ? ?Family History: ? ?There is no family history of premature cerebrovascular disease pertinent to this consultation ? ?ROS : ?14 Points Review of Systems was performed and was negative except mentioned in  HPI. ? ?Past Surgical History: ?There Is No Surgical History Contributory To Today?s Visit ? ? ? ?Examination: ?BP(159/104), Pulse(88), ?1A: Level of Consciousness - Alert; keenly responsive + 0 ?1B: Ask Month and Age - Both Questions Right + 0 ?1C: Blink Eyes &  Squeeze Hands - Performs Both Tasks + 0 ?2: Test Horizontal Extraocular Movements - Normal + 0 ?3: Test Visual Fields - No Visual Loss + 0 ?4: Test Facial Palsy (Use Grimace if Obtunded) - Normal symmetry + 0 ?5A: Test Left Arm Motor Drift - No Drift for 10 Seconds + 0 ?5B: Test Right Arm Motor Drift - No Drift for 10 Seconds + 0 ?6A: Test Left Leg Motor Drift - No Drift for 5 Seconds + 0 ?6B: Test Right Leg Motor Drift - No Drift for 5 Seconds + 0 ?7: Test Limb Ataxia (FNF/Heel-Shin) - No Ataxia + 0 ?8: Test Sensation - Normal; No sensory loss + 0 ?9: Test Language/Aphasia - Normal; No aphasia + 0 ?10: Test Dysarthria - Normal + 0 ?11: Test Extinction/Inattention - No abnormality + 0 ? ?NIHSS Score: 0 ? ?NIHSS Free Text : ambulate independently ? ?Pre-Morbid Modified Rankin Scale: ?0 Points = No symptoms at all ? ? ?Patient/Family was informed the Neurology Consult would occur via TeleHealth consult by way of interactive audio and video telecommunications and consented to receiving care in this manner. ? ? ?Patient is being evaluated for possible acute neurologic impairment and high probability of imminent or life-threatening deterioration. I spent total of 30 minutes providing care to this patient, including time for face to face visit via telemedicine, review of medical records, imaging studies and discussion of findings with providers, the patient and/or family. ? ? ?Dr Edward Jolly ? ? ?[facility] ?[phone] ? ? ?Case IF:[id number] ? ?

## 2022-02-06 NOTE — ED Triage Notes (Signed)
Pt arrived with family from home who reports pt was last known normal to her baseline at 1930 but at 2030 when family returned home, pt was altered and had slurred speech. Family reports EMS came to home and performed stroke screening and said it was negative but reported pt had high BP. Pt to ED with daughter with slight aphasia noted, no facial droop, no drift present, strong grip bilaterally. Pt taken to [facility] provider to have screening prior to CT scan for CODE STROKE.  ? ? ?LKW 1930 ?Symptoms noticed by family @ 2030 ? ?CBG 126 ?HR 88 ?BP 178/98 ?O2 99% RA ?RR 17 ?

## 2022-02-06 NOTE — Progress Notes (Signed)
Code Stroke Activated @2251 . Dr. Corlis Leak on Stroke Cart @2300 . NCCT-negative, ASPECTS 10. Results given to Dr. Corlis Leak on camera @2300  ?

## 2022-02-06 NOTE — ED Provider Notes (Signed)
? ?[facility] ?Provider Note ? ? ? Event Date/Time  ? First MD Initiated Contact with Patient 02/06/22 2301   ?  (approximate) ? ? ?History  ? ?Aphasia and Altered Mental Status ? ? ?HPI ? ?Amanda Romero is a 66 y.o. female with past medical history of hypertension and arthritis who presents with slurred speech and altered mental status.  Patient was in her normal state of health around 7:30 PM when she was with her daughter.  Her daughter left and then when she came back around 8:30 PM patient was sitting down and was noted to have slurred speech, difficulty forming her words and seemed to be confused.  She was not making sense.  The daughter denies any other focal findings.  By the time EMS arrived she was starting to improve.  Patient's daughter notes that she is much improved from when she saw her initially but still think she is a little bit slow to respond.  Of note patient is hard of hearing at baseline.  Patient denies any acute complaints.  She does not really remember feeling abnormal but does note that her daughter said she was not acting normal.  She denies headache nausea vomiting fevers chills abdominal pain or urinary symptoms.  His daughter notes that she had a similar episode last year and was work-up for stroke ended up having a UTI.  She is not on any blood thinners. ?  ? ?Past Medical History:  ?Diagnosis Date  ? Arthritis   ? Hypertension   ? ? ?Patient Active Problem List  ? Diagnosis Date Noted  ? TIA (transient ischemic attack) 02/07/2022  ? Hypertension   ? Hypokalemia   ? ? ? ?Physical Exam  ?Triage Vital Signs: ?ED Triage Vitals [02/06/22 2247]  ?Enc Vitals Group  ?   BP (!) 178/98  ?   Pulse Rate 88  ?   Resp 17  ?   Temp   ?   Temp src   ?   SpO2 99 %  ?   Weight 156 lb 12.8 oz (71.1 kg)  ?   Height   ?   Head Circumference   ?   Peak Flow   ?   Pain Score   ?   Pain Loc   ?   Pain Edu?   ?   Excl. in GC?   ? ? ?Most recent vital signs: ?Vitals:  ? 02/07/22  0230 02/07/22 0500  ?BP: (!) 112/57 130/68  ?Pulse: 74 85  ?Resp: 18 17  ?Temp:    ?SpO2: 93% 92%  ? ? ? ?General: Awake, no distress.  ?CV:  Good peripheral perfusion.  ?Resp:  Normal effort.  ?Abd:  No distention.  ?Neuro:             Awake, Alert, Oriented x 3  ?Other:  Aox3, nml speech  ?PERRL, EOMI, face symmetric, nml tongue movement  ?5/5 strength in the BL upper and lower extremities  ?Sensation grossly intact in the BL upper and lower extremities  ?Finger-nose-finger intact BL ?Nml gait ?+ hard of hearing ? ? ? ?ED Results / Procedures / Treatments  ?Labs ?(all labs ordered are listed, but only abnormal results are displayed) ?Labs Reviewed  ?CBC - Abnormal; Notable for the following components:  ?    Result Value  ? RBC 5.15 (*)   ? HCT 46.2 (*)   ? All other components within normal limits  ?COMPREHENSIVE METABOLIC PANEL - Abnormal;  Notable for the following components:  ? Potassium 3.3 (*)   ? Glucose, Bld 106 (*)   ? Creatinine, Ser 1.16 (*)   ? GFR, Estimated 52 (*)   ? All other components within normal limits  ?URINALYSIS, COMPLETE (UACMP) WITH MICROSCOPIC - Abnormal; Notable for the following components:  ? Color, Urine YELLOW (*)   ? APPearance HAZY (*)   ? Leukocytes,Ua MODERATE (*)   ? Bacteria, UA RARE (*)   ? All other components within normal limits  ?LIPID PANEL - Abnormal; Notable for the following components:  ? Triglycerides 223 (*)   ? HDL 34 (*)   ? VLDL 45 (*)   ? LDL Cholesterol 117 (*)   ? All other components within normal limits  ?CBG MONITORING, ED - Abnormal; Notable for the following components:  ? Glucose-Capillary 126 (*)   ? All other components within normal limits  ?RESP PANEL BY RT-PCR (FLU A&B, COVID) ARPGX2  ?URINE CULTURE  ?PROTIME-INR  ?APTT  ?DIFFERENTIAL  ?HIV ANTIBODY (ROUTINE TESTING W REFLEX)  ?HEMOGLOBIN A1C  ?CBG MONITORING, ED  ? ? ? ?EKG ? ?EKG interpreted by myself, normal sinus rhythm, normal axis normal intervals, inferior Q waves ? ? ?RADIOLOGY ?I reviewed  the CT scan of the brain which does not show any acute intracranial process; agree with radiology report  ? ? ? ?PROCEDURES: ? ?Critical Care performed: No ? ?.1-3 Lead EKG Interpretation ?Performed by: Georga Hacking, MD ?Authorized by: Georga Hacking, MD  ? ?  Interpretation: normal   ?  ECG rate assessment: normal   ?  Rhythm: sinus rhythm   ?  Ectopy: none   ?  Conduction: normal   ? ?The patient is on the cardiac monitor to evaluate for evidence of arrhythmia and/or significant heart rate changes. ? ? ?MEDICATIONS ORDERED IN ED: ?Medications  ? stroke: mapping our early stages of recovery book (has no administration in time range)  ?acetaminophen (TYLENOL) tablet 650 mg (has no administration in time range)  ?  Or  ?acetaminophen (TYLENOL) 160 MG/5ML solution 650 mg (has no administration in time range)  ?  Or  ?acetaminophen (TYLENOL) suppository 650 mg (has no administration in time range)  ?enoxaparin (LOVENOX) injection 40 mg (has no administration in time range)  ?aspirin EC tablet 81 mg (has no administration in time range)  ?clopidogrel (PLAVIX) tablet 75 mg (has no administration in time range)  ?aspirin chewable tablet 324 mg (324 mg Oral Given 02/07/22 0001)  ?clopidogrel (PLAVIX) tablet 300 mg (300 mg Oral Given 02/07/22 0003)  ?potassium chloride SA (KLOR-CON M) CR tablet 40 mEq (40 mEq Oral Given 02/07/22 0003)  ? ? ? ?IMPRESSION / MDM / ASSESSMENT AND PLAN / ED COURSE  ?I reviewed the triage vital signs and the nursing notes. ?             ?               ? ?Differential diagnosis includes, but is not limited to, TIA, CVA, seizure, UTI, metabolic encephalopathy ? ?This patient is a 66 year old female with a history of hypertension who presents with acute slurred speech and altered mental status.  Patient was noted to be normal by her daughter around 7:30 PM and then when she returned around 8:30 PM the patient was having difficulty speaking, described as difficulty getting words out and not  making sense.  There were no other focal deficits noted by her daughter.  Apparently the stroke  screen by EMS was negative.  She was transported to the ED by her daughter.  Per triage nurse, patient was still confused some aphasia on arrival so code stroke was called.  By the time of my evaluation patient appears to be back to baseline, her NIH score is 0 she has no focal deficits noted by myself although she is somewhat hard of hearing.  Patient's daughter who is at bedside notes that she is much improved but still feels to be somewhat slow to respond.  Patient denies any acute complaints at this time.  Of note she had a similar episode when she had a UTI last year apparently and was worked up for stroke.  She does not take any antiplatelet agents currently.  Patient was evaluated by teleneurology who I conversed with as well.  They are recommending dual antiplatelet therapy with aspirin and Plavix for 21 days followed by aspirin Wells admission for TIA work-up.  Patient CT head is negative.  Permissive hypertension allowed for.  We will send a UA to rule out UTI.  The rest of her labs are overall unremarkable, potassium is 3.3, will supplement.  UA does have WBCs but a significant number of squames and patient has no urinary symptoms I do not feel strongly about treating her for UTI.  She was given aspirin and Plavix load in the ED.  Discussed with the hospitalist for admission. ? ?  ? ? ?FINAL CLINICAL IMPRESSION(S) / ED DIAGNOSES  ? ?Final diagnoses:  ?TIA (transient ischemic attack)  ? ? ? ?Rx / DC Orders  ? ?ED Discharge Orders   ? ? None  ? ?  ? ? ? ?Note:  This document was prepared using Dragon voice recognition software and may include unintentional dictation errors. ?  ?Georga Hacking, MD ?02/07/22 [phone] ? ?

## 2022-02-06 NOTE — ED Notes (Signed)
Tele Neuro on with pt at this time ?

## 2022-02-07 ENCOUNTER — Observation Stay: Payer: PPO

## 2022-02-07 ENCOUNTER — Observation Stay
Admit: 2022-02-07 | Discharge: 2022-02-07 | Disposition: A | Discharge status: Home / Self Care | Payer: PPO | Attending: Internal Medicine | Admitting: Internal Medicine

## 2022-02-07 ENCOUNTER — Other Ambulatory Visit: Payer: Self-pay

## 2022-02-07 DIAGNOSIS — I10 Essential (primary) hypertension: Secondary | ICD-10-CM

## 2022-02-07 DIAGNOSIS — E876 Hypokalemia: Secondary | ICD-10-CM

## 2022-02-07 DIAGNOSIS — G459 Transient cerebral ischemic attack, unspecified: Secondary | ICD-10-CM

## 2022-02-07 LAB — LIPID PANEL
Cholesterol: 196 mg/dL (ref 0–200)
HDL: 34 mg/dL — ABNORMAL LOW
LDL Cholesterol: 117 mg/dL — ABNORMAL HIGH (ref 0–99)
Total CHOL/HDL Ratio: 5.8 ratio
Triglycerides: 223 mg/dL — ABNORMAL HIGH
VLDL: 45 mg/dL — ABNORMAL HIGH (ref 0–40)

## 2022-02-07 LAB — ECHOCARDIOGRAM COMPLETE
AR max vel: 2.9 cm2
AV Area VTI: 2.67 cm2
AV Area mean vel: 2.83 cm2
AV Mean grad: 3 mmHg
AV Peak grad: 5.6 mmHg
Ao pk vel: 1.18 m/s
Area-P 1/2: 4.57 cm2
Height: 58 in
MV VTI: 3.26 cm2
S' Lateral: 3.08 cm
Weight: 2508.8 oz

## 2022-02-07 LAB — RESP PANEL BY RT-PCR (FLU A&B, COVID) ARPGX2
Influenza A by PCR: NEGATIVE
Influenza B by PCR: NEGATIVE
SARS Coronavirus 2 by RT PCR: NEGATIVE

## 2022-02-07 LAB — URINALYSIS, COMPLETE (UACMP) WITH MICROSCOPIC
Bilirubin Urine: NEGATIVE
Glucose, UA: NEGATIVE mg/dL
Hgb urine dipstick: NEGATIVE
Ketones, ur: NEGATIVE mg/dL
Nitrite: NEGATIVE
Protein, ur: NEGATIVE mg/dL
Specific Gravity, Urine: 1.021 (ref 1.005–1.030)
pH: 5 (ref 5.0–8.0)

## 2022-02-07 LAB — HEMOGLOBIN A1C
Hgb A1c MFr Bld: 5.8 % — ABNORMAL HIGH (ref 4.8–5.6)
Mean Plasma Glucose: 120 mg/dL

## 2022-02-07 LAB — HIV ANTIBODY (ROUTINE TESTING W REFLEX): HIV Screen 4th Generation wRfx: NONREACTIVE

## 2022-02-07 MED ORDER — ACETAMINOPHEN 160 MG/5ML PO SOLN
650.0000 mg | ORAL | Status: DC | PRN
  Filled 2022-02-07: qty 20.3

## 2022-02-07 MED ORDER — ASPIRIN EC 81 MG PO TBEC
81.0000 mg | DELAYED_RELEASE_TABLET | Freq: Every day | ORAL | Status: DC
  Administered 2022-02-07 – 2022-02-08 (×2): 81 mg via ORAL
  Filled 2022-02-07 (×2): qty 1

## 2022-02-07 MED ORDER — ACETAMINOPHEN 650 MG RE SUPP: 650.0000 mg | RECTAL | Status: DC | PRN

## 2022-02-07 MED ORDER — ACETAMINOPHEN 325 MG PO TABS: 650.0000 mg | ORAL_TABLET | ORAL | Status: DC | PRN

## 2022-02-07 MED ORDER — STROKE: EARLY STAGES OF RECOVERY BOOK: Freq: Once | Status: AC

## 2022-02-07 MED ORDER — ENOXAPARIN SODIUM 40 MG/0.4ML IJ SOSY
40.0000 mg | PREFILLED_SYRINGE | INTRAMUSCULAR | Status: DC
  Administered 2022-02-07 – 2022-02-08 (×2): 40 mg via SUBCUTANEOUS
  Filled 2022-02-07 (×2): qty 0.4

## 2022-02-07 MED ORDER — HYDRALAZINE HCL 20 MG/ML IJ SOLN
10.0000 mg | INTRAMUSCULAR | Status: DC | PRN
  Administered 2022-02-07: 10 mg via INTRAVENOUS
  Filled 2022-02-07: qty 1

## 2022-02-07 MED ORDER — CLOPIDOGREL BISULFATE 75 MG PO TABS
75.0000 mg | ORAL_TABLET | Freq: Every day | ORAL | Status: DC
  Administered 2022-02-08: 75 mg via ORAL
  Filled 2022-02-07: qty 1

## 2022-02-07 NOTE — ED Notes (Signed)
Ambulatory with steady gait with PT in h/w ?

## 2022-02-07 NOTE — ED Notes (Signed)
US at BS

## 2022-02-07 NOTE — Assessment & Plan Note (Addendum)
Symptoms of slurred speech/word blocking/confusion as reported by family have resolved. Neuroimaging negative for stroke. No cardioembolic source on echo, IAS not well visualized. ?- Recommend establishing care with PCP for ongoing chronic medical management. Will likely require daily antihypertensive and would benefit from statin initiation with LDL 117. Weight loss is also recommended due to BMI of 32.  ?

## 2022-02-07 NOTE — Assessment & Plan Note (Signed)
Received 40 mEq KCl p.o. in the ED ?

## 2022-02-07 NOTE — Progress Notes (Signed)
SLP Cancellation Note ? ?Patient Details ?Name: Amanda Romero ?MRN: [id number] ?DOB: 1956/03/28 ? ? ?Cancelled treatment:       Reason Eval/Treat Not Completed: SLP screened, no needs identified, will sign off (chart reviewed; consulted pt in room) ?Pt denied any difficulty swallowing and is currently on a regular diet; tolerates swallowing pills w/ water per NSG. Pt conversed in lengthy conversation w/out overt expressive/receptive deficits noted; pt denied any speech-language deficits. Speech clear. Noted results of MRI: "No acute intracranial process. No evidence of acute or subacute infarct.  T2 hyperintense signal in the periventricular white matter and pons, likely the sequela of moderate chronic small vessel ischemic disease. 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  ?No further skilled ST services indicated as pt appears at her baseline. Pt agreed. Pt was instructed to f/u w/ her PCP if she notices any deficits in her normal ADL communications post discharge home. Pt agreed. NSG to reconsult if any change in status while admitted.   ? ? ? ? ?Jerilynn Som, MS, CCC-SLP ?Speech Language Pathologist ?Rehab Services; [facility] [place] ?[phone] (ascom) ?Dash Cardarelli ?02/07/2022, 12:39 PM ?

## 2022-02-07 NOTE — Evaluation (Signed)
Physical Therapy Evaluation ?Patient Details ?Name: Amanda Romero ?MRN: [id number] ?DOB: August 18, 1956 ?Today's Date: 02/07/2022 ? ?History of Present Illness ? Pt is a 66 y.o. female presenting to hospital 3/5 with transient confusion and slurred speech.  CT of head and MRI of brain negative for acute intracranial abnormality.  Pt admitted for TIA workup.  PMH includes htn and back surgery (pt also reports h/o neck and rotator cuff surgery).  ?Clinical Impression ? Prior to hospital admission, pt was independent with functional mobility and active[place] her daughter's family since Thanksgiving.  Currently pt is independent with bed mobility, transfers, and ambulation (no AD use).  Pt steady without any loss of balance during sessions activities.  No UE or LE impairments noted.  No reports of pain.  Pt's BP 175/82 at rest beginning of session and 189/92 post activity (nurse notified of pt's elevated BP).  Pt appears to be at baseline level of functional mobility; no acute PT needs identified so PT will sign off (pt in agreement).   ? ?Recommendations for follow up therapy are one component of a multi-disciplinary discharge planning process, led by the attending physician.  Recommendations may be updated based on patient status, additional functional criteria and insurance authorization. ? ?Follow Up Recommendations No PT follow up ? ?  ?Assistance Recommended at Discharge None  ?Patient can return home with the following ?   ? ?  ?Equipment Recommendations None recommended by PT  ?Recommendations for Other Services ?    ?  ?Functional Status Assessment Patient has not had a recent decline in their functional status  ? ?  ?Precautions / Restrictions Precautions ?Precautions: None ?Restrictions ?Weight Bearing Restrictions: No  ? ?  ? ?Mobility ? Bed Mobility ?Overal bed mobility: Independent ?  ?  ?  ?  ?  ?  ?General bed mobility comments: Supine to/from sitting without any  noted difficulties ?  ? ?Transfers ?Overall transfer level: Independent ?Equipment used: None ?  ?  ?  ?  ?  ?  ?  ?General transfer comment: steady safe transfers noted ?  ? ?Ambulation/Gait ?Ambulation/Gait assistance: Independent ?Gait Distance (Feet): 200 Feet ?Assistive device: None ?Gait Pattern/deviations: WFL(Within Functional Limits) ?Gait velocity: normal ?  ?  ?General Gait Details: steady step through ambulation ? ?Stairs ?  ?  ?  ?  ?  ? ?Wheelchair Mobility ?  ? ?Modified Rankin (Stroke Patients Only) ?  ? ?  ? ?Balance Overall balance assessment: Independent ?Sitting-balance support: No upper extremity supported, Feet unsupported ?Sitting balance-Leahy Scale: Normal ?Sitting balance - Comments: steady sitting reaching outside BOS ?  ?Standing balance support: No upper extremity supported, During functional activity ?Standing balance-Leahy Scale: Normal ?Standing balance comment: steady with ambulation and dynamic standing activities; no loss of balance noted ?  ?  ?  ?  ?  ?  ?  ?  ?  ?  ?  ?   ? ? ? ?Pertinent Vitals/Pain Pain Assessment ?Pain Assessment: No/denies pain ?HR 83 bpm at rest (beginning/end of session) and briefly increased to 110 bpm with ambulation.  ? ? ?Home Living Family/patient expects to be discharged to:: Private residence ?Living Arrangements: Children;Other relatives ([place] been staying at her daughter's home since Thanksgiving (and plans to go back to her daughter's home)) ?Available Help at Discharge: Family;Available PRN/intermittently ?Type of Home: House ?Home Access: Stairs to enter ?Entrance Stairs-Rails: None ?Entrance Stairs-Number  of Steps: 2-3 ?  ?Home Layout: One level ?Home Equipment: None ?   ?  ?Prior Function Prior Level of Function : Independent/Modified Independent ?  ?  ?  ?  ?  ?  ?Mobility Comments: No falls reported in past 6 months.  Pt reports walking 4-5 miles a day and is active. ?  ?  ? ? ?Hand Dominance  ?   ? ?   ?Extremity/Trunk Assessment  ? Upper Extremity Assessment ?Upper Extremity Assessment: Overall WFL for tasks assessed (5/5 B UE shoulder flexion and elbow flexion/extension; good B hand grip strength; no sensation or coordination deficits noted) ?  ? ?Lower Extremity Assessment ?Lower Extremity Assessment: Overall WFL for tasks assessed (5/5 B hip flexion, knee flexion/extension, and DF/PF; able to SLR B LE's independently; intact B LE light touch, heel to shin coordination, tone, and proprioception) ?  ? ?Cervical / Trunk Assessment ?Cervical / Trunk Assessment: Normal  ?Communication  ? Communication: HOH  ?Cognition Arousal/Alertness: Awake/alert ?Behavior During Therapy: [facility] for tasks assessed/performed ?Overall Cognitive Status: Within Functional Limits for tasks assessed ?  ?  ?  ?  ?  ?  ?  ?  ?  ?  ?  ?  ?  ?  ?  ?  ?General Comments: A&O x4 ?  ?  ? ?  ?General Comments General comments (skin integrity, edema, etc.): scratches noted on pt's arms (pt reports from playing with dogs at home).  Nursing cleared pt for participation in physical therapy.  Pt agreeable to PT session. ? ?  ?Exercises    ? ?Assessment/Plan  ?  ?PT Assessment Patient does not need any further PT services  ?PT Problem List   ? ?   ?  ?PT Treatment Interventions     ? ?PT Goals (Current goals can be found in the Care Plan section)  ?Acute Rehab PT Goals ?Patient Stated Goal: to go home and be active ?PT Goal Formulation: With patient ?Time For Goal Achievement: 02/21/22 ?Potential to Achieve Goals: Good ? ?  ?Frequency   ?  ? ? ?Co-evaluation   ?  ?  ?  ?  ? ? ?  ?AM-PAC PT "6 Clicks" Mobility  ?Outcome Measure Help needed turning from your back to your side while in a flat bed without using bedrails?: None ?Help needed moving from lying on your back to sitting on the side of a flat bed without using bedrails?: None ?Help needed moving to and from a bed to a chair (including a wheelchair)?: None ?Help needed standing up from a chair  using your arms (e.g., wheelchair or bedside chair)?: None ?Help needed to walk in hospital room?: None ?Help needed climbing 3-5 steps with a railing? : None ?6 Click Score: 24 ? ?  ?End of Session   ?Activity Tolerance: Patient tolerated treatment well ?Patient left: in bed;with call bell/phone within reach ?Nurse Communication: Mobility status;Precautions;Other (comment) (pt's BP 175/82 pre-activity and 189/92 post activity) ?PT Visit Diagnosis: Other symptoms and signs involving the nervous system (R29.898) ?  ? ?Time: 7096-2836 ?PT Time Calculation (min) (ACUTE ONLY): 22 min ? ? ?Charges:   PT Evaluation ?$PT Eval Low Complexity: 1 Low ?  ?  ?   ? ?Hendricks Limes, PT ?02/07/22, 9:37 AM ? ? ?

## 2022-02-07 NOTE — Discharge Summary (Signed)
Physician Discharge Summary   Patient: Amanda Romero MRN: [id number] DOB: Sep 16, 1956  Admit date:     02/06/2022  Discharge date: 02/08/22  Discharge Physician: Tyrone Nine   PCP: Pcp, No   Recommendations at discharge:  Establish care with PCP for ongoing management of HTN and HLD. Started norvasc 5mg  daily Started atorvastatin 40mg  daily Started DAPT x3 weeks, then ASA 81mg  alone.  Pt has follow up 02/21/2022 with Richarda Blade, NP.  Discharge Diagnoses: Principal Problem:   TIA (transient ischemic attack) Active Problems:   Hypertension   Hypokalemia  Hospital Course: Amanda Romero is a 66 y.o. female with a history of HTN who presented to the ED 3/6 at the insistence of her daughter due to confusion, slurred speech. CT head showed no acute pathology and symptoms resolved in the ED. Teleneurology recommended TIA work up. MRI brain showed no acute abnormalities. Carotid dopplers revealed no significant stenosis. LDL was 117. Neurology evaluated the patient, recommending MRA head which showed diffuse intracranial atherosclerosis without critical lesion. The patient's symptoms have durably resolved and neurology has cleared for discharge. Please see details below.   Assessment and Plan: * TIA (transient ischemic attack) Symptoms of slurred speech/word blocking/confusion as reported by family have resolved. Neuroimaging negative for stroke. No cardioembolic source on echo, IAS not well visualized. - Has already scheduled appointment to establish care with PCP for ongoing chronic medical management. Initiate daily antihypertensive and recheck BP at follow up. Weve gone forward with statin initiation with LDL 117. Weight loss is also recommended due to BMI of 32.  - DAPT x3 weeks, then ASA alone per neuro  Consultants: Neurology Procedures performed: None  Disposition: Home Diet recommendation:  Cardiac diet DISCHARGE MEDICATION: Allergies as of 02/08/2022   No Known  Allergies      Medication List     TAKE these medications    acetaminophen 650 MG CR tablet Commonly known as: TYLENOL Take 650 mg by mouth every 8 (eight) hours as needed for pain.   amLODipine 5 MG tablet Commonly known as: NORVASC Take 1 tablet (5 mg total) by mouth daily.   aspirin 81 MG EC tablet Take 1 tablet (81 mg total) by mouth daily. Swallow whole. Start taking on: February 09, 2022   atorvastatin 40 MG tablet Commonly known as: LIPITOR Take 1 tablet (40 mg total) by mouth daily.   clopidogrel 75 MG tablet Commonly known as: PLAVIX Take 1 tablet (75 mg total) by mouth daily. Start taking on: February 09, 2022   NON FORMULARY Take 1 tablet by mouth daily. Unknown Blood Pressure Medication       Subjective: Feels well, thinking clearly, back to her baseline. Eating well, ambulating. No headache, chest pain, dyspnea, numbness or weakness.   Discharge Exam: BP (!) 163/94 (BP Location: Left Arm)    Pulse 77    Temp 97.8 F (36.6 C) (Oral)    Resp 16    Ht 4\' 10"  (1.473 m)    Wt 71.1 kg    SpO2 99%    BMI 32.77 kg/m   Gen: Pleasant 65yo F in no distress Pulm: Nonlabored and clear CV: RRR, no MRG, no JVD no edema.  Neuro: Alert, oriented, no focal deficits.   Condition at discharge: stable  The results of significant diagnostics from this hospitalization (including imaging, microbiology, ancillary and laboratory) are listed below for reference.   Imaging Studies: MR ANGIO HEAD WO CONTRAST  Result Date: 02/08/2022 CLINICAL DATA:  Slurred speech,  question stroke EXAM: MRA HEAD WITHOUT CONTRAST TECHNIQUE: Angiographic images of the Circle of Willis were acquired using MRA technique without intravenous contrast. COMPARISON:  Brain MRI from yesterday FINDINGS: Anterior circulation: Atheromatous plaque along the carotid siphons without flow limiting stenosis. Diffuse atheromatous irregularity of medium size vessels with high-grade right A2 and right M2 segment narrowings.  Mild narrowing at the right M1 segment. Negative for aneurysm Posterior circulation: Left dominant vertebral artery. Atheromatous irregularity affecting the vertebral and basilar arteries with high-grade right distal V4 segment stenosis and mild basilar narrowing. Focal high-grade right P2 segment stenosis. Diffuse atheromatous irregularity of the posterior cerebral arteries. Negative for aneurysm Anatomic variants: None significant IMPRESSION: Extensive intracranial atherosclerosis. Notable high-grade narrowings at the distal right V4 , right M2, right P2, and right A2 segments. Electronically Signed   By: Tiburcio Pea M.D.   On: 02/08/2022 07:08   MR BRAIN WO CONTRAST  Result Date: 02/07/2022 CLINICAL DATA:  TIA, slurred speech EXAM: MRI HEAD WITHOUT CONTRAST TECHNIQUE: Multiplanar, multiecho pulse sequences of the brain and surrounding structures were obtained without intravenous contrast. COMPARISON:  No prior MRI, correlation is made with CT head 02/06/2022 FINDINGS: Brain: No restricted diffusion to suggest acute or subacute infarct. No acute hemorrhage, mass, mass effect, or midline shift. No hydrocephalus or extra-axial collection. T2 hyperintense signal in the periventricular white matter and pons, likely the sequela of moderate chronic small vessel ischemic disease. 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 Punctate foci of hemosiderin deposition in the right basal ganglia, thalamus, and medial pons likely sequela of prior hypertensive microhemorrhages. Vascular: Normal flow voids. Skull and upper cervical spine: Normal marrow signal. Sinuses/Orbits: Negative. Other: The mastoids are well aerated. IMPRESSION: No acute intracranial process. No evidence of acute or subacute infarct. Electronically Signed   By: Wiliam Ke M.D.   On: 02/07/2022 03:27   US Carotid Bilateral  Result Date: 02/07/2022 CLINICAL DATA:  TIA.  History of hypertension. EXAM: BILATERAL CAROTID DUPLEX  ULTRASOUND TECHNIQUE: Wallace Cullens scale imaging, color Doppler and duplex ultrasound were performed of bilateral carotid and vertebral arteries in the neck. COMPARISON:  None. FINDINGS: Criteria: Quantification of carotid stenosis is based on velocity parameters that correlate the residual internal carotid diameter with NASCET-based stenosis levels, using the diameter of the distal internal carotid lumen as the denominator for stenosis measurement. The following velocity measurements were obtained: RIGHT ICA: 99/37 cm/sec CCA: 103/21 cm/sec SYSTOLIC ICA/CCA RATIO:  1.0 ECA: 116 cm/sec LEFT ICA: 79/30 cm/sec CCA: 90/20 cm/sec SYSTOLIC ICA/CCA RATIO:  0.9 ECA: 121 cm/sec RIGHT CAROTID ARTERY: There is a minimal amount intimal thickening/hypoechoic plaque involving the distal aspect the right common carotid artery (image 15), extending to involve the right carotid bulb (image 18). There is a minimal amount of intimal thickening/hypoechoic plaque involving the origin and proximal aspects of the right internal carotid artery (image 24), not resulting in elevated peak systolic velocities within the interrogated course of the right internal carotid artery to suggest a hemodynamically significant stenosis. RIGHT VERTEBRAL ARTERY:  Antegrade Flow LEFT CAROTID ARTERY: There is a very minimal amount of intimal thickening involving the left carotid bulb (image 51), extending to involve the origin and proximal aspects of the left internal carotid artery (image 58), not resulting in elevated peak systolic velocities within the interrogated course of the left internal carotid artery to suggest a hemodynamically significant stenosis. LEFT VERTEBRAL ARTERY:  Antegrade flow IMPRESSION: Minimal amount of bilateral intimal thickening/atherosclerotic plaque, right greater than left, not resulting  in a hemodynamically significant stenosis within either internal carotid artery. Electronically Signed   By: Simonne Come M.D.   On: 02/07/2022 11:34    ECHOCARDIOGRAM COMPLETE  Result Date: 02/07/2022    ECHOCARDIOGRAM REPORT   Patient Name:   TEMISHA MURLEY Date of Exam: 02/07/2022 Medical Rec #:  [id number]         Height:       58.0 in Accession #:    [id number]        Weight:       156.8 lb Date of Birth:  11-29-1956         BSA:          1.642 m Patient Age:    65 years          BP:           189/92 mmHg Patient Gender: F                 HR:           74 bpm. Exam Location:  [facility] Procedure: 2D Echo, Color Doppler and Cardiac Doppler Indications:     I63.9 Stroke  History:         Patient has no prior history of Echocardiogram examinations.                  Signs/Symptoms:inability to speak; Risk Factors:Hypertension.  Sonographer:     Humphrey Rolls Referring Phys:  [id number] Andris Baumann Diagnosing Phys: Sena Slate IMPRESSIONS  1. Left ventricular ejection fraction, by estimation, is 60 to 65%. The left ventricle has normal function. The left ventricle has no regional wall motion abnormalities. There is mild left ventricular hypertrophy. Left ventricular diastolic parameters are consistent with Grade I diastolic dysfunction (impaired relaxation).  2. Right ventricular systolic function is normal. The right ventricular size is normal.  3. The mitral valve is normal in structure. Trivial mitral valve regurgitation. No evidence of mitral stenosis.  4. The aortic valve is normal in structure. Aortic valve regurgitation is not visualized. No aortic stenosis is present. FINDINGS  Left Ventricle: Left ventricular ejection fraction, by estimation, is 60 to 65%. The left ventricle has normal function. The left ventricle has no regional wall motion abnormalities. The left ventricular internal cavity size was normal in size. There is  mild left ventricular hypertrophy. Left ventricular diastolic parameters are consistent with Grade I diastolic dysfunction (impaired relaxation). Right Ventricle: The right ventricular size is normal. Right vetricular wall thickness was  not well visualized. Right ventricular systolic function is normal. Left Atrium: Left atrial size was normal in size. Right Atrium: Right atrial size was normal in size. Pericardium: There is no evidence of pericardial effusion. Mitral Valve: The mitral valve is normal in structure. Trivial mitral valve regurgitation. No evidence of mitral valve stenosis. MV peak gradient, 4.8 mmHg. The mean mitral valve gradient is 2.0 mmHg. Tricuspid Valve: The tricuspid valve is grossly normal. Tricuspid valve regurgitation is not demonstrated. Aortic Valve: The aortic valve is normal in structure. Aortic valve regurgitation is not visualized. No aortic stenosis is present. Aortic valve mean gradient measures 3.0 mmHg. Aortic valve peak gradient measures 5.6 mmHg. Aortic valve area, by VTI measures 2.67 cm. Pulmonic Valve: The pulmonic valve was not well visualized. Pulmonic valve regurgitation is not visualized. Aorta: The aortic root is normal in size and structure. Venous: The inferior vena cava was not well visualized. IAS/Shunts: The interatrial septum was not well visualized.  LEFT VENTRICLE PLAX  2D LVIDd:         4.37 cm   Diastology LVIDs:         3.08 cm   LV e' medial:    3.70 cm/s LV PW:         1.11 cm   LV E/e' medial:  12.5 LV IVS:        0.74 cm   LV e' lateral:   4.03 cm/s LVOT diam:     2.00 cm   LV E/e' lateral: 11.5 LV SV:         66 LV SV Index:   40 LVOT Area:     3.14 cm  RIGHT VENTRICLE RV Basal diam:  3.95 cm LEFT ATRIUM             Index        RIGHT ATRIUM           Index LA diam:        2.20 cm 1.34 cm/m   RA Area:     19.40 cm LA Vol (A2C):   39.5 ml 24.05 ml/m  RA Volume:   59.70 ml  36.35 ml/m LA Vol (A4C):   63.9 ml 38.91 ml/m LA Biplane Vol: 50.8 ml 30.93 ml/m  AORTIC VALVE                    PULMONIC VALVE AV Area (Vmax):    2.90 cm     PV Vmax:       0.81 m/s AV Area (Vmean):   2.83 cm     PV Vmean:      53.500 cm/s AV Area (VTI):     2.67 cm     PV VTI:        0.129 m AV Vmax:            118.00 cm/s  PV Peak grad:  2.6 mmHg AV Vmean:          88.200 cm/s  PV Mean grad:  1.0 mmHg AV VTI:            0.247 m AV Peak Grad:      5.6 mmHg AV Mean Grad:      3.0 mmHg LVOT Vmax:         109.00 cm/s LVOT Vmean:        79.500 cm/s LVOT VTI:          0.210 m LVOT/AV VTI ratio: 0.85  AORTA Ao Root diam: 3.10 cm MITRAL VALVE MV Area (PHT): 4.57 cm    SHUNTS MV Area VTI:   3.26 cm    Systemic VTI:  0.21 m MV Peak grad:  4.8 mmHg    Systemic Diam: 2.00 cm MV Mean grad:  2.0 mmHg MV Vmax:       1.09 m/s MV Vmean:      63.4 cm/s MV Decel Time: 166 msec MV E velocity: 46.30 cm/s MV A velocity: 98.10 cm/s MV E/A ratio:  0.47 Sena Slateyan Orgel Electronically signed by Sena Slateyan Orgel Signature Date/Time: 02/07/2022/12:58:51 PM    Final    CT HEAD CODE STROKE WO CONTRAST  Result Date: 02/06/2022 CLINICAL DATA:  Code stroke.  Altered mental status, slurred speech EXAM: CT HEAD WITHOUT CONTRAST TECHNIQUE: Contiguous axial images were obtained from the base of the skull through the vertex without intravenous contrast. RADIATION DOSE REDUCTION: This exam was performed according to the departmental dose-optimization program which includes automated exposure control, adjustment of the mA and/or kV  according to patient size and/or use of iterative reconstruction technique. COMPARISON:  None. FINDINGS: Brain: No evidence of acute infarction, hemorrhage, cerebral edema, mass, mass effect, or midline shift. Ventricles and sulci are normal for age. No extra-axial fluid collection. Periventricular white matter changes, likely the sequela of chronic small vessel ischemic disease. Remote lacunar infarct in the left pons. Vascular: No hyperdense vessel or unexpected calcification. Skull: Normal. Negative for fracture or focal lesion. Sinuses/Orbits: No acute finding. Other: The mastoid air cells are well aerated. ASPECTS [facility] Stroke Program Early CT Score) - Ganglionic level infarction (caudate, lentiform nuclei, internal capsule,  insula, M1-M3 cortex): 7 - Supraganglionic infarction (M4-M6 cortex): 3 Total score (0-10 with 10 being normal): 10 IMPRESSION: 1. No acute intracranial process. 2. ASPECTS is 10 Code stroke imaging results were communicated on 02/06/2022 at 10:54 pm to provider Derrill Kay via telephone, who verbally acknowledged these results. Electronically Signed   By: Wiliam Ke M.D.   On: 02/06/2022 22:55    Microbiology: Results for orders placed or performed during the hospital encounter of 02/06/22  Resp Panel by RT-PCR (Flu A&B, Covid) Nasopharyngeal Swab     Status: None   Collection Time: 02/06/22 10:57 PM   Specimen: Nasopharyngeal Swab; Nasopharyngeal(NP) swabs in vial transport medium  Result Value Ref Range Status   SARS Coronavirus 2 by RT PCR NEGATIVE NEGATIVE Final    Comment: (NOTE) SARS-CoV-2 target nucleic acids are NOT DETECTED.  The SARS-CoV-2 RNA is generally detectable in upper respiratory specimens during the acute phase of infection. The lowest concentration of SARS-CoV-2 viral copies this assay can detect is 138 copies/mL. A negative result does not preclude SARS-Cov-2 infection and should not be used as the sole basis for treatment or other patient management decisions. A negative result may occur with  improper specimen collection/handling, submission of specimen other than nasopharyngeal swab, presence of viral mutation(s) within the areas targeted by this assay, and inadequate number of viral copies(<138 copies/mL). A negative result must be combined with clinical observations, patient history, and epidemiological information. The expected result is Negative.  Fact Sheet for Patients:  BloggerCourse.com  Fact Sheet for Healthcare Providers:  SeriousBroker.it  This test is no t yet approved or cleared by the [place] FDA and  has been authorized for detection and/or diagnosis of SARS-CoV-2 by FDA under an Emergency Use  Authorization (EUA). This EUA will remain  in effect (meaning this test can be used) for the duration of the COVID-19 declaration under Section 564(b)(1) of the Act, 21 U.S.C.section 360bbb-3(b)(1), unless the authorization is terminated  or revoked sooner.       Influenza A by PCR NEGATIVE NEGATIVE Final   Influenza B by PCR NEGATIVE NEGATIVE Final    Comment: (NOTE) The Xpert Xpress SARS-CoV-2/FLU/RSV plus assay is intended as an aid in the diagnosis of influenza from Nasopharyngeal swab specimens and should not be used as a sole basis for treatment. Nasal washings and aspirates are unacceptable for Xpert Xpress SARS-CoV-2/FLU/RSV testing.  Fact Sheet for Patients: BloggerCourse.com  Fact Sheet for Healthcare Providers: SeriousBroker.it  This test is not yet approved or cleared by the [place] FDA and has been authorized for detection and/or diagnosis of SARS-CoV-2 by FDA under an Emergency Use Authorization (EUA). This EUA will remain in effect (meaning this test can be used) for the duration of the COVID-19 declaration under Section 564(b)(1) of the Act, 21 U.S.C. section 360bbb-3(b)(1), unless the authorization is terminated or revoked.  Performed at [facility], [phone]  [address] [place], [place] [address]   Urine Culture     Status: Abnormal   Collection Time: 02/07/22 12:00 AM   Specimen: Urine, Clean Catch  Result Value Ref Range Status   Specimen Description   Final    URINE, CLEAN CATCH Performed at [facility], [address] [place], [place] [address]    Special Requests   Final    NONE Performed at [facility], [address] [place], [place] [address]    Culture MULTIPLE SPECIES PRESENT, SUGGEST RECOLLECTION (A)  Final   Report Status 02/08/2022 FINAL  Final    Labs: CBC: Recent Labs  Lab 02/06/22 2257  WBC 8.9  NEUTROABS 6.2  HGB 15.0  HCT 46.2*  MCV 89.7   PLT 314   Basic Metabolic Panel: Recent Labs  Lab 02/06/22 2257  NA 142  K 3.3*  CL 105  CO2 26  GLUCOSE 106*  BUN 23  CREATININE 1.16*  CALCIUM 9.6   Liver Function Tests: Recent Labs  Lab 02/06/22 2257  AST 25  ALT 17  ALKPHOS 42  BILITOT 0.6  PROT 7.7  ALBUMIN 4.0   CBG: Recent Labs  Lab 02/06/22 2240  GLUCAP 126*    Discharge time spent: greater than 30 minutes.  Signed: Tyrone Nineyan B Karl Erway, MD [facility] 02/08/2022

## 2022-02-07 NOTE — ED Notes (Signed)
Back to bed and monitor from [facility], no changes.  ?

## 2022-02-07 NOTE — ED Notes (Addendum)
Alert, NAD, calm, interactive, MAEx4, speech clear. ?

## 2022-02-07 NOTE — Assessment & Plan Note (Signed)
Permissive hypertension 

## 2022-02-07 NOTE — Progress Notes (Signed)
Patient seen.  Awake alert denies symptoms ?Communicated normal work-up thus far ?

## 2022-02-07 NOTE — ED Notes (Signed)
PT into room, at BS 

## 2022-02-07 NOTE — Progress Notes (Signed)
, ?  Transition of Care (TOC) Screening Note ? ? ?Patient Details  ?Name: Amanda Romero ?Date of Birth: [birth year] ? ? ?Transition of Care (TOC) CM/SW Contact:    ?Allayne Butcher, RN ?Phone Number: ?02/07/2022, 2:46 PM ? ? ? ?Transition of Care Department [facility]) has reviewed patient and no TOC needs have been identified at this time. We will continue to monitor patient advancement through interdisciplinary progression rounds. If new patient transition needs arise, please place a TOC consult. ?  ?

## 2022-02-07 NOTE — Progress Notes (Signed)
OT Cancellation Note ? ?Patient Details ?Name: Amanda Romero ?MRN: [id number] ?DOB: Sep 21, 1956 ? ? ?Cancelled Treatment:    Reason Eval/Treat Not Completed: OT screened, no needs identified, will sign off. Per chart review, pt and family reporting she has returned to baseline. Upon arriving at room, pt having worked with PT who reports pt is indeed at baseline level of function. OT to SIGN OFF. ? ? ?

## 2022-02-07 NOTE — Care Management Obs Status (Signed)
MEDICARE OBSERVATION STATUS NOTIFICATION ? ? ?Patient Details  ?Name: Amanda Romero ?MRN: [id number] ?Date of Birth: 08/17/1956 ? ? ?Medicare Observation Status Notification Given:  Yes ? ? ? ?Shelbie Hutching, RN ?02/07/2022, 2:36 PM ?

## 2022-02-07 NOTE — Progress Notes (Signed)
?   02/07/22 2231  ?Assess: MEWS Score  ?Temp 97.8 ?F (36.6 ?C)  ?BP (!) 211/99  ?Pulse Rate 88  ?Resp 16  ?SpO2 100 %  ?O2 Device Room Air  ?Assess: MEWS Score  ?MEWS Temp 0  ?MEWS Systolic 2  ?MEWS Pulse 0  ?MEWS RR 0  ?MEWS LOC 0  ?MEWS Score 2  ?MEWS Score Color Yellow  ?Assess: if the MEWS score is Yellow or Red  ?Were vital signs taken at a resting state? Yes  ?Focused Assessment No change from prior assessment  ?Does the patient meet 2 or more of the SIRS criteria? No  ?MEWS guidelines implemented *See Row Information* Yes  ?Treat  ?MEWS Interventions Administered prn meds/treatments  ?Pain Scale 0-10  ?Pain Score 0  ?Take Vital Signs  ?Increase Vital Sign Frequency  Yellow: Q 2hr X 2 then Q 4hr X 2, if remains yellow, continue Q 4hrs  ?Escalate  ?MEWS: Escalate Yellow: discuss with charge nurse/RN and consider discussing with provider and RRT  ?Notify: Charge Nurse/RN  ?Name of Charge Nurse/RN Notified [place], RN  ?Date Charge Nurse/RN Notified 02/07/22  ?Time Charge Nurse/RN Notified 2235  ?Notify: Provider  ?Provider Name/Title Dr. Katrinka Blazing  ?Date Provider Notified 02/07/22  ?Time Provider Notified 2035  ?Notification Type Page ?(secure chat)  ?Notification Reason Other (Comment) ?(protocol)  ?Provider response See new orders  ?Date of Provider Response 02/07/22  ?Time of Provider Response 2240  ?Document  ?Patient Outcome Stabilized after interventions  ?Progress note created (see row info) Yes  ?Assess: SIRS CRITERIA  ?SIRS Temperature  0  ?SIRS Pulse 0  ?SIRS Respirations  0  ?SIRS WBC 0  ?SIRS Score Sum  0  ? ? ?

## 2022-02-07 NOTE — Consult Note (Signed)
NEURO HOSPITALIST CONSULT NOTE   Requestig physician: Dr. Jarvis NewcomerGrunz  Reason for Consult:TIA symptoms  History obtained from:   Patient and Chart     HPI:                                                                                                                                          Val RilesDonna M Holsomback is an 66 y.o. female who presented for TIA symptoms consisting of confusion and slurred speech. LKN was 7:30 PM on Sunday. She was seen by Teleneurology on Sunday night with stroke/TIA work up recommended. NIHSS was 0. She was hypertensive in the ED with a BP of 178/98.  The patient's recollection of her recent symptoms on this evening's evaluation match those described in the Hospitalist's HPI: "Val RilesDonna M Zundel is a 66 y.o. female with medical history significant for HTN who presents to the ED for evaluation of difficulty word finding and confusion.  Last known normal was at 7:30 PM.  She was previously in her usual state of health.  She arrived by EMS within the tPA window with already improving symptoms and a code stroke was initiated.  Initial CT head showed no acute findings.  Teleneurology consult was done recommendations outlined in their note.  tPA was not recommended.  At the time of my evaluation, daughter who was in the room stated that her mother was back to her baseline.  Patient is being admitted for stroke work-up."  Past Medical History:  Diagnosis Date   Arthritis    Hypertension     Past Surgical History:  Procedure Laterality Date   BACK SURGERY      History reviewed. No pertinent family history.            Social History:  reports that she has never smoked. She has never used smokeless tobacco. She reports that she does not currently use alcohol. She reports that she does not currently use drugs.  No Known Allergies  MEDICATIONS:                                                                                                                      Prior to Admission:  Medications Prior to Admission  Medication Sig Dispense Refill Last Dose   acetaminophen (TYLENOL)  650 MG CR tablet Take 650 mg by mouth every 8 (eight) hours as needed for pain.   prn at prn   NON FORMULARY Take 1 tablet by mouth daily. Unknown Blood Pressure Medication   unknown at unknown   Scheduled:  aspirin EC  81 mg Oral Daily   [START ON 02/08/2022] clopidogrel  75 mg Oral Daily   enoxaparin (LOVENOX) injection  40 mg Subcutaneous Q24H   Continuous:   ROS:                                                                                                                                       As per HPI. Comprehensive ROS at the time of neurology evaluation is negative.    Blood pressure (!) 211/99, pulse 88, temperature 97.8 F (36.6 C), temperature source Oral, resp. rate 16, height 4\' 10"  (1.473 m), weight 71.1 kg, SpO2 100 %.   General Examination:                                                                                                       Physical Exam  HEENT-  [place]/AT    Lungs- Respirations unlabored Extremities- No edema   Neurological Examination Mental Status: Alert, oriented x 5, thought content appropriate.  Speech fluent without evidence of aphasia. Naming and repetition intact. No dysarthria. Able to follow all commands without difficulty. Cranial Nerves: II: Temporal visual fields intact with no extinction to DSS. PERRL.  III,IV, VI: EOMI.  V: Intact to temp bilaterally VII: Smile symmetric VIII: Hearing intact to voice IX,X: No hypophonia XI: Symmetric XII: Midline tongue extension Motor: Right : Upper extremity   5/5    Left:     Upper extremity   5/5  Lower extremity   5/5     Lower extremity   5/5 Tone and bulk:normal tone throughout; no atrophy noted Sensory: Temp and light touch intact throughout, bilaterally. No extinction to DSS.  Deep Tendon Reflexes: 2+ and symmetric throughout Cerebellar: No ataxia with FNF  bilaterally  Gait: Deferred   Lab Results: Basic Metabolic Panel: Recent Labs  Lab 02/06/22 2257  NA 142  K 3.3*  CL 105  CO2 26  GLUCOSE 106*  BUN 23  CREATININE 1.16*  CALCIUM 9.6    CBC: Recent Labs  Lab 02/06/22 2257  WBC 8.9  NEUTROABS 6.2  HGB 15.0  HCT 46.2*  MCV 89.7  PLT 314    Cardiac Enzymes: No results for input(s): CKTOTAL,  CKMB, CKMBINDEX, TROPONINI in the last 168 hours.  Lipid Panel: Recent Labs  Lab 02/07/22 0531  CHOL 196  TRIG 223*  HDL 34*  CHOLHDL 5.8  VLDL 45*  LDLCALC 117*    Imaging: MR BRAIN WO CONTRAST  Result Date: 02/07/2022 CLINICAL DATA:  TIA, slurred speech EXAM: MRI HEAD WITHOUT CONTRAST TECHNIQUE: Multiplanar, multiecho pulse sequences of the brain and surrounding structures were obtained without intravenous contrast. COMPARISON:  No prior MRI, correlation is made with CT head 02/06/2022 FINDINGS: Brain: No restricted diffusion to suggest acute or subacute infarct. No acute hemorrhage, mass, mass effect, or midline shift. No hydrocephalus or extra-axial collection. T2 hyperintense signal in the periventricular white matter and pons, likely the sequela of moderate chronic small vessel ischemic disease. 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 Punctate foci of hemosiderin deposition in the right basal ganglia, thalamus, and medial pons likely sequela of prior hypertensive microhemorrhages. Vascular: Normal flow voids. Skull and upper cervical spine: Normal marrow signal. Sinuses/Orbits: Negative. Other: The mastoids are well aerated. IMPRESSION: No acute intracranial process. No evidence of acute or subacute infarct. Electronically Signed   By: Wiliam Ke M.D.   On: 02/07/2022 03:27   US Carotid Bilateral  Result Date: 02/07/2022 CLINICAL DATA:  TIA.  History of hypertension. EXAM: BILATERAL CAROTID DUPLEX ULTRASOUND TECHNIQUE: Wallace Cullens scale imaging, color Doppler and duplex ultrasound were performed of bilateral  carotid and vertebral arteries in the neck. COMPARISON:  None. FINDINGS: Criteria: Quantification of carotid stenosis is based on velocity parameters that correlate the residual internal carotid diameter with NASCET-based stenosis levels, using the diameter of the distal internal carotid lumen as the denominator for stenosis measurement. The following velocity measurements were obtained: RIGHT ICA: 99/37 cm/sec CCA: 103/21 cm/sec SYSTOLIC ICA/CCA RATIO:  1.0 ECA: 116 cm/sec LEFT ICA: 79/30 cm/sec CCA: 90/20 cm/sec SYSTOLIC ICA/CCA RATIO:  0.9 ECA: 121 cm/sec RIGHT CAROTID ARTERY: There is a minimal amount intimal thickening/hypoechoic plaque involving the distal aspect the right common carotid artery (image 15), extending to involve the right carotid bulb (image 18). There is a minimal amount of intimal thickening/hypoechoic plaque involving the origin and proximal aspects of the right internal carotid artery (image 24), not resulting in elevated peak systolic velocities within the interrogated course of the right internal carotid artery to suggest a hemodynamically significant stenosis. RIGHT VERTEBRAL ARTERY:  Antegrade Flow LEFT CAROTID ARTERY: There is a very minimal amount of intimal thickening involving the left carotid bulb (image 51), extending to involve the origin and proximal aspects of the left internal carotid artery (image 58), not resulting in elevated peak systolic velocities within the interrogated course of the left internal carotid artery to suggest a hemodynamically significant stenosis. LEFT VERTEBRAL ARTERY:  Antegrade flow IMPRESSION: Minimal amount of bilateral intimal thickening/atherosclerotic plaque, right greater than left, not resulting in a hemodynamically significant stenosis within either internal carotid artery. Electronically Signed   By: Simonne Come M.D.   On: 02/07/2022 11:34   ECHOCARDIOGRAM COMPLETE  Result Date: 02/07/2022    ECHOCARDIOGRAM REPORT   Patient Name:   JENNET SCROGGIN Date of Exam: 02/07/2022 Medical Rec #:  [id number]         Height:       58.0 in Accession #:    [id number]        Weight:       156.8 lb Date of Birth:  [birth year]         BSA:  1.642 m Patient Age:    65 years          BP:           189/92 mmHg Patient Gender: F                 HR:           74 bpm. Exam Location:  [facility] Procedure: 2D Echo, Color Doppler and Cardiac Doppler Indications:     I63.9 Stroke  History:         Patient has no prior history of Echocardiogram examinations.                  Signs/Symptoms:inability to speak; Risk Factors:Hypertension.  Sonographer:     Humphrey Rolls Referring Phys:  [id number] Andris Baumann Diagnosing Phys: Sena Slate IMPRESSIONS  1. Left ventricular ejection fraction, by estimation, is 60 to 65%. The left ventricle has normal function. The left ventricle has no regional wall motion abnormalities. There is mild left ventricular hypertrophy. Left ventricular diastolic parameters are consistent with Grade I diastolic dysfunction (impaired relaxation).  2. Right ventricular systolic function is normal. The right ventricular size is normal.  3. The mitral valve is normal in structure. Trivial mitral valve regurgitation. No evidence of mitral stenosis.  4. The aortic valve is normal in structure. Aortic valve regurgitation is not visualized. No aortic stenosis is present. FINDINGS  Left Ventricle: Left ventricular ejection fraction, by estimation, is 60 to 65%. The left ventricle has normal function. The left ventricle has no regional wall motion abnormalities. The left ventricular internal cavity size was normal in size. There is  mild left ventricular hypertrophy. Left ventricular diastolic parameters are consistent with Grade I diastolic dysfunction (impaired relaxation). Right Ventricle: The right ventricular size is normal. Right vetricular wall thickness was not well visualized. Right ventricular systolic function is normal. Left Atrium: Left atrial size was  normal in size. Right Atrium: Right atrial size was normal in size. Pericardium: There is no evidence of pericardial effusion. Mitral Valve: The mitral valve is normal in structure. Trivial mitral valve regurgitation. No evidence of mitral valve stenosis. MV peak gradient, 4.8 mmHg. The mean mitral valve gradient is 2.0 mmHg. Tricuspid Valve: The tricuspid valve is grossly normal. Tricuspid valve regurgitation is not demonstrated. Aortic Valve: The aortic valve is normal in structure. Aortic valve regurgitation is not visualized. No aortic stenosis is present. Aortic valve mean gradient measures 3.0 mmHg. Aortic valve peak gradient measures 5.6 mmHg. Aortic valve area, by VTI measures 2.67 cm. Pulmonic Valve: The pulmonic valve was not well visualized. Pulmonic valve regurgitation is not visualized. Aorta: The aortic root is normal in size and structure. Venous: The inferior vena cava was not well visualized. IAS/Shunts: The interatrial septum was not well visualized.  LEFT VENTRICLE PLAX 2D LVIDd:         4.37 cm   Diastology LVIDs:         3.08 cm   LV e' medial:    3.70 cm/s LV PW:         1.11 cm   LV E/e' medial:  12.5 LV IVS:        0.74 cm   LV e' lateral:   4.03 cm/s LVOT diam:     2.00 cm   LV E/e' lateral: 11.5 LV SV:         66 LV SV Index:   40 LVOT Area:     3.14 cm  RIGHT VENTRICLE RV  Basal diam:  3.95 cm LEFT ATRIUM             Index        RIGHT ATRIUM           Index LA diam:        2.20 cm 1.34 cm/m   RA Area:     19.40 cm LA Vol (A2C):   39.5 ml 24.05 ml/m  RA Volume:   59.70 ml  36.35 ml/m LA Vol (A4C):   63.9 ml 38.91 ml/m LA Biplane Vol: 50.8 ml 30.93 ml/m  AORTIC VALVE                    PULMONIC VALVE AV Area (Vmax):    2.90 cm     PV Vmax:       0.81 m/s AV Area (Vmean):   2.83 cm     PV Vmean:      53.500 cm/s AV Area (VTI):     2.67 cm     PV VTI:        0.129 m AV Vmax:           118.00 cm/s  PV Peak grad:  2.6 mmHg AV Vmean:          88.200 cm/s  PV Mean grad:  1.0 mmHg AV  VTI:            0.247 m AV Peak Grad:      5.6 mmHg AV Mean Grad:      3.0 mmHg LVOT Vmax:         109.00 cm/s LVOT Vmean:        79.500 cm/s LVOT VTI:          0.210 m LVOT/AV VTI ratio: 0.85  AORTA Ao Root diam: 3.10 cm MITRAL VALVE MV Area (PHT): 4.57 cm    SHUNTS MV Area VTI:   3.26 cm    Systemic VTI:  0.21 m MV Peak grad:  4.8 mmHg    Systemic Diam: 2.00 cm MV Mean grad:  2.0 mmHg MV Vmax:       1.09 m/s MV Vmean:      63.4 cm/s MV Decel Time: 166 msec MV E velocity: 46.30 cm/s MV A velocity: 98.10 cm/s MV E/A ratio:  0.47 Sena Slate Electronically signed by Sena Slate Signature Date/Time: 02/07/2022/12:58:51 PM    Final    CT HEAD CODE STROKE WO CONTRAST  Result Date: 02/06/2022 CLINICAL DATA:  Code stroke.  Altered mental status, slurred speech EXAM: CT HEAD WITHOUT CONTRAST TECHNIQUE: Contiguous axial images were obtained from the base of the skull through the vertex without intravenous contrast. RADIATION DOSE REDUCTION: This exam was performed according to the departmental dose-optimization program which includes automated exposure control, adjustment of the mA and/or kV according to patient size and/or use of iterative reconstruction technique. COMPARISON:  None. FINDINGS: Brain: No evidence of acute infarction, hemorrhage, cerebral edema, mass, mass effect, or midline shift. Ventricles and sulci are normal for age. No extra-axial fluid collection. Periventricular white matter changes, likely the sequela of chronic small vessel ischemic disease. Remote lacunar infarct in the left pons. Vascular: No hyperdense vessel or unexpected calcification. Skull: Normal. Negative for fracture or focal lesion. Sinuses/Orbits: No acute finding. Other: The mastoid air cells are well aerated. ASPECTS [facility] Stroke Program Early CT Score) - Ganglionic level infarction (caudate, lentiform nuclei, internal capsule, insula, M1-M3 cortex): 7 - Supraganglionic infarction (M4-M6 cortex): 3 Total score (0-10 with 10  being  normal): 10 IMPRESSION: 1. No acute intracranial process. 2. ASPECTS is 10 Code stroke imaging results were communicated on 02/06/2022 at 10:54 pm to provider Derrill Kay via telephone, who verbally acknowledged these results. Electronically Signed   By: Wiliam Ke M.D.   On: 02/06/2022 22:55     Assessment: 66 year old female with TIA symptoms 1. Exam is nonfocal.  2. MRI brain: No acute intracranial process. No evidence of acute or subacute infarct.  3. Carotid ultrasound:  Minimal amount of bilateral intimal thickening/atherosclerotic plaque, right greater than left, not resulting in a hemodynamically significant stenosis within either internal carotid artery.  4. TTE: Left ventricular ejection fraction, by estimation, is 60 to 65%. No valvular vegetation or mural thrombus mentioned in the report.    Recommendations: 1. BP management. Now out of the permissive HTN time window.  2. Agree with starting ASA and Plavix. Can stop Plavix after 21 days, continuing ASA indefinitely.   3. MRA of head to complete stroke imaging workup 4. Cardiac telemetry 5. Start atorvastatin.  6. PT/OT/Speech 7. Outpatient Neurology follow up.  8. Neurohospitalist service will follow PRN. Please call us if MRA head is positive.    Electronically signed: Dr. Caryl Pina 02/07/2022, 10:42 PM

## 2022-02-07 NOTE — H&P (Addendum)
?History and Physical  ? ? ?Patient: Amanda Romero MVH:[id number] DOB: November 03, 1956 ?DOA: 02/06/2022 ?DOS: the patient was seen and examined on 02/07/2022 ?PCP: Pcp, No  ?Patient coming from: Home ? ?Chief Complaint:  ?Chief Complaint  ?Patient presents with  ? Aphasia  ? Altered Mental Status  ? ? ?HPI: Amanda Romero is a 66 y.o. female with medical history significant for HTN who presents to the ED for evaluation of difficulty word finding and confusion.  Last known normal was at 7:30 PM.  She was previously in her usual state of health.  She arrived by EMS within the tPA window with already improving symptoms and a code stroke was initiated.  Initial CT head showed no acute findings.  Teleneurology consult was done recommendations outlined in their note.  tPA was not recommended.  At the time of my evaluation, daughter who was in the room stated that her mother was back to her baseline.  Patient is being admitted for stroke work-up ? ?Data review.  ? Vitals BP 178/98 otherwise normal ?Blood work significant for potassium of 3.3 and creatinine of 1.16 with unknown baseline.  Urinalysis showed moderate leukocyte esterases ?EKG, personally viewed and interpreted with NSR with no acute ST-T wave changes.  CT head nonacute  ? ?Review of Systems: As mentioned in the history of present illness. All other systems reviewed and are negative. ?Past Medical History:  ?Diagnosis Date  ? Arthritis   ? Hypertension   ? ?Past Surgical History:  ?Procedure Laterality Date  ? BACK SURGERY    ? ?Social History:  reports that she has never smoked. She has never used smokeless tobacco. She reports that she does not currently use alcohol. She reports that she does not currently use drugs. ? ?No Known Allergies ? ?History reviewed. No pertinent family history. ? ?Prior to Admission medications   ?Medication Sig Start Date End Date Taking? Authorizing Provider  ?acetaminophen (TYLENOL) 650 MG CR tablet Take 650 mg by mouth every 8  (eight) hours as needed for pain.   Yes [provider]  ?NON FORMULARY Take 1 tablet by mouth daily. Unknown Blood Pressure Medication   Yes [provider]  ? ? ?Physical Exam: ?Vitals:  ? 02/06/22 2330 02/07/22 0000 02/07/22 0004 02/07/22 0030  ?BP: (!) 169/86 (!) 163/75  (!) 146/69  ?Pulse: 86 83  83  ?Resp: (!) 23 (!) 22  16  ?Temp:   (!) 97.5 ?F (36.4 ?C)   ?TempSrc:   Oral   ?SpO2: 97% 94%  96%  ?Weight:      ? ?Physical Exam ?Vitals and nursing note reviewed.  ?Constitutional:   ?   General: She is not in acute distress. ?   Appearance: Normal appearance.  ?HENT:  ?   Head: Normocephalic and atraumatic.  ?Cardiovascular:  ?   Rate and Rhythm: Normal rate and regular rhythm.  ?   Pulses: Normal pulses.  ?   Heart sounds: Normal heart sounds. No murmur heard. ?Pulmonary:  ?   Effort: Pulmonary effort is normal.  ?   Breath sounds: Normal breath sounds. No wheezing or rhonchi.  ?Abdominal:  ?   General: Bowel sounds are normal.  ?   Palpations: Abdomen is soft.  ?   Tenderness: There is no abdominal tenderness.  ?Musculoskeletal:     ?   General: No swelling or tenderness. Normal range of motion.  ?   Cervical back: Normal range of motion and neck supple.  ?Skin: ?  General: Skin is warm and dry.  ?Neurological:  ?   General: No focal deficit present.  ?   Mental Status: She is alert. Mental status is at baseline.  ?Psychiatric:     ?   Mood and Affect: Mood normal.     ?   Behavior: Behavior normal.  ? ? ? ?Data Reviewed: ? ?Updated medications and problem lists for reconciliation ?ED course, including vitals, labs, imaging, treatment and response to treatment ?Triage notes, nursing and pharmacy notes and ED provider's notes ?Notable results as noted in HPI  ?teleneurology recommendations ? ? ?Assessment and Plan: ?* TIA (transient ischemic attack) ?Teleneurology Recommendations: ?  ?      Stroke/Telemetry Floor ?      Neuro Checks ?      Bedside Swallow Eval ?      DVT Prophylaxis ?      IV  Fluids, Normal Saline ?      Head of Bed 30 Degrees ?      Euglycemia and Avoid Hyperthermia (PRN Acetaminophen) ?      Initiate dual antiplatelet therapy with Aspirin 81 mg daily and Clopidogrel 75 mg daily. ?      Antihypertensives PRN if Blood pressure is greater than 220/120 or there is a concern for End organ damage/contraindications for permissive HTN. If blood pressure is greater than 220/120 give labetalol PO or IV or Vasotec IV with a goal of 15% reduction in BP during the first 24 hours. ?  ?Routine Consultation with Inhouse Neurology for Follow up Care ? ?Additionally we will get echo, carotid Doppler and MRI ? ?Hypokalemia ?Received 40 mEq KCl p.o. in the ED ? ?Hypertension ?Permissive hypertension ? ? ? ? ? ? ?Advance Care Planning:   Code Status: Full Code  ? ?Consults: Neurology, Dr. Otelia Limes ? ?Family Communication: Daughter at bedside ? ?Severity of Illness: ?The appropriate patient status for this patient is OBSERVATION. Observation status is judged to be reasonable and necessary in order to provide the required intensity of service to ensure the patient's safety. The patient's presenting symptoms, physical exam findings, and initial radiographic and laboratory data in the context of their medical condition is felt to place them at decreased risk for further clinical deterioration. Furthermore, it is anticipated that the patient will be medically stable for discharge from the hospital within 2 midnights of admission.  ? ?Author: ?Andris Baumann, MD ?02/07/2022 2:22 AM ? ?For on call review [email or web address].  ?

## 2022-02-07 NOTE — Progress Notes (Signed)
*  PRELIMINARY RESULTS* ?Echocardiogram ?2D Echocardiogram has been performed. ? ?Amanda Romero ?02/07/2022, 11:46 AM ?

## 2022-02-08 ENCOUNTER — Other Ambulatory Visit: Payer: Self-pay

## 2022-02-08 ENCOUNTER — Observation Stay: Payer: PPO

## 2022-02-08 DIAGNOSIS — G459 Transient cerebral ischemic attack, unspecified: Secondary | ICD-10-CM | POA: Diagnosis not present

## 2022-02-08 LAB — URINE CULTURE

## 2022-02-08 MED ORDER — AMLODIPINE BESYLATE 5 MG PO TABS
5.0000 mg | ORAL_TABLET | Freq: Every day | ORAL | 0 refills | Status: DC
Start: 2022-02-08 — End: 2022-02-21
  Filled 2022-02-08: qty 30, 30d supply, fill #0

## 2022-02-08 MED ORDER — CLOPIDOGREL BISULFATE 75 MG PO TABS: 75.0000 mg | ORAL_TABLET | Freq: Every day | ORAL | 0 refills | Status: DC

## 2022-02-08 MED ORDER — ATORVASTATIN CALCIUM 40 MG PO TABS
40.0000 mg | ORAL_TABLET | Freq: Every day | ORAL | 0 refills | Status: DC
Start: 2022-02-08 — End: 2022-02-17
  Filled 2022-02-08: qty 30, 30d supply, fill #0

## 2022-02-08 MED ORDER — ASPIRIN 81 MG PO TBEC
81.0000 mg | DELAYED_RELEASE_TABLET | Freq: Every day | ORAL | 0 refills | Status: AC
  Filled 2022-02-08: qty 30, 30d supply, fill #0

## 2022-02-08 MED ORDER — ATORVASTATIN CALCIUM 40 MG PO TABS: 40.0000 mg | ORAL_TABLET | Freq: Every day | ORAL | 1 refills | Status: DC

## 2022-02-08 MED ORDER — CLOPIDOGREL BISULFATE 75 MG PO TABS
75.0000 mg | ORAL_TABLET | Freq: Every day | ORAL | 0 refills | Status: DC
Start: 2022-02-09 — End: 2023-06-16
  Filled 2022-02-08: qty 21, 21d supply, fill #0

## 2022-02-08 MED ORDER — ASPIRIN 81 MG PO TBEC: 81.0000 mg | DELAYED_RELEASE_TABLET | Freq: Every day | ORAL | 1 refills | Status: DC

## 2022-02-08 NOTE — Progress Notes (Signed)
Patient was discharged to home. Reviewed AVS, new medications, and signs or a stroke with the patient and her daughter. MD started norvasc, and all printed prescriptions were provided.  ?

## 2022-02-08 NOTE — Progress Notes (Signed)
PROGRESS NOTE ?Date of Service: 02/07/2022 ?Brief Narrative: ?Amanda Romero is a 66 y.o. female with a history of HTN who presented to the ED 3/6 at the insistence of her daughter due to confusion, slurred speech. CT head showed no acute pathology and symptoms resolved in the ED. Teleneurology recommended TIA work up. MRI brain showed no acute abnormalities. Carotid dopplers revealed no significant stenosis. LDL was 117. Neurology evaluated the patient on 3/6 recommending MRA head which I have ordered. ? ?Subjective: ?No new complaints. ? ?Objective: ?BP (!) 174/91 (BP Location: Left Arm)   Pulse (!) 101   Temp 97.8 ?F (36.6 ?C)   Resp 16   Ht 4\' 10"  (1.473 m)   Wt 71.1 kg   SpO2 100%   BMI 32.77 kg/m?   ?Gen: No distress ?Pulm: Clear and nonlabored  ?CV: RRR, no murmur, no JVD, no edema ?Neuro: Alert and oriented. No focal deficits. ? ?Assessment & Plan: ?Continue stroke work up per neurology recommendations. Updated on 3/7: Order MRA per Dr. Cheral Marker recommendation. Plan to discharge 3/7 if no further work up indicated by that study. Will need PCP follow up for HTN, HLD. Start DAPT x21 days, then aspirin alone, start statin.  ? ?Patrecia Pour, MD ?Pager on amion ?02/08/2022, 6:33 AM   ?

## 2022-02-21 ENCOUNTER — Other Ambulatory Visit: Payer: Self-pay

## 2022-02-21 ENCOUNTER — Encounter: Payer: Self-pay | Admitting: Family

## 2022-02-21 ENCOUNTER — Ambulatory Visit (INDEPENDENT_AMBULATORY_CARE_PROVIDER_SITE_OTHER): Payer: PPO | Admitting: Family

## 2022-02-21 ENCOUNTER — Encounter: Payer: Self-pay | Admitting: Neurology

## 2022-02-21 VITALS — BP 160/102 | HR 92 | Temp 97.7°F | Resp 16 | Ht <= 58 in | Wt 156.4 lb

## 2022-02-21 DIAGNOSIS — Z8673 Personal history of transient ischemic attack (TIA), and cerebral infarction without residual deficits: Secondary | ICD-10-CM

## 2022-02-21 DIAGNOSIS — H903 Sensorineural hearing loss, bilateral: Secondary | ICD-10-CM | POA: Diagnosis not present

## 2022-02-21 DIAGNOSIS — Z7689 Persons encountering health services in other specified circumstances: Secondary | ICD-10-CM

## 2022-02-21 DIAGNOSIS — R7303 Prediabetes: Secondary | ICD-10-CM

## 2022-02-21 DIAGNOSIS — I10 Essential (primary) hypertension: Secondary | ICD-10-CM | POA: Diagnosis not present

## 2022-02-21 DIAGNOSIS — G8929 Other chronic pain: Secondary | ICD-10-CM

## 2022-02-21 DIAGNOSIS — M545 Low back pain, unspecified: Secondary | ICD-10-CM

## 2022-02-21 MED ORDER — MELOXICAM 15 MG PO TABS: 15.0000 mg | ORAL_TABLET | Freq: Every day | ORAL | 0 refills | Status: DC

## 2022-02-21 MED ORDER — AMLODIPINE BESYLATE 10 MG PO TABS
10.0000 mg | ORAL_TABLET | Freq: Every day | ORAL | 0 refills | Status: DC
Start: 2022-02-21 — End: 2022-05-20

## 2022-02-21 NOTE — Progress Notes (Signed)
Provider: Richarda Blade FNP-C   Dexter Signor, Donalee Citrin, NP  Patient Care Team: Amalya Salmons, Donalee Citrin, NP as PCP - General (Family Medicine)  Extended Emergency Contact Information Primary Emergency Contact: [facility] Phone: [phone] Relation: Daughter  Code Status:  Full code  Goals of care: Advanced Directive information    02/21/2022   10:31 AM  Advanced Directives  Does Patient Have a Medical Advance Directive? No  Would patient like information on creating a medical advance directive? No - Patient declined     Chief Complaint  Patient presents with   Establish Care    New Patient.    HPI:  Pt is a 66 y.o. female seen today establish care here at [facility] for medical management of chronic diseases Has a medical history of hypertension, hyperlipidemia, TIA, chronic low back pain, chronic right shoulder pain among others. She is status post hospital admission from 02/06/2022 to 02/08/2022 after she presented to the ED due to confusion and slurred speech.  She had CT scan and MRI done which showed no acute abnormalities.Carotid Doppler was revealed no significant stenosis. She was evaluated by neurologist who recommended an MRA of the head which showed diffuse intracranial atherosclerosis without critical lesions.  Total cholesterol was 196, triglycerides 223 and LDL elevated at 117 she was started on Norvasc 5 mg daily, Atorvastatin 40 Mg Daily,DAPT x3 weeks, then aspirin 81 mg alone.  She was advised to follow-up with PCP but did not have a PCP since she recently moved to stay with her daughter here in [place] from [place].  She is here with her daughter today.States atorvastatin was ordered during hospital discharge but has started taking medication.     Past Medical History:  Diagnosis Date   Arthritis    Hypertension    TIA (transient ischemic attack) 2023   Per new patient form   Past Surgical History:  Procedure Laterality Date    BACK SURGERY     MRI  2023   ER doctor; Per new patient form   ROTATOR CUFF REPAIR     Per new patient form    No Known Allergies  Allergies as of 02/21/2022   No Known Allergies      Medication List        Accurate as of February 21, 2022 11:59 PM. If you have any questions, ask your nurse or doctor.          acetaminophen 650 MG CR tablet Commonly known as: TYLENOL Take 650 mg by mouth every 8 (eight) hours as needed for pain.   amLODipine 10 MG tablet Commonly known as: NORVASC Take 1 tablet (10 mg total) by mouth daily. What changed:  medication strength how much to take Changed by: Caesar Bookman, NP   aspirin 81 MG EC tablet Take 1 tablet (81 mg total) by mouth daily. Swallow whole.   clopidogrel 75 MG tablet Commonly known as: PLAVIX Take 1 tablet (75 mg total) by mouth daily.   meloxicam 15 MG tablet Commonly known as: MOBIC Take 1 tablet (15 mg total) by mouth daily. Started by: Caesar Bookman, NP        Review of Systems  Constitutional:  Negative for appetite change, chills, fatigue, fever and unexpected weight change.  HENT:  Positive for hearing loss. Negative for congestion, dental problem, ear discharge, ear pain, facial swelling, nosebleeds, postnasal drip, rhinorrhea, sinus pressure, sinus pain, sneezing, sore throat, tinnitus and trouble swallowing.   Eyes:  Negative for pain, discharge, redness, itching and visual disturbance.  Respiratory:  Negative for cough, chest tightness, shortness of breath and wheezing.   Cardiovascular:  Negative for chest pain, palpitations and leg swelling.  Gastrointestinal:  Negative for abdominal distention, abdominal pain, blood in stool, constipation, diarrhea, nausea and vomiting.  Endocrine: Negative for cold intolerance, heat intolerance, polydipsia, polyphagia and polyuria.  Genitourinary:  Negative for difficulty urinating, dysuria, flank pain, frequency and urgency.  Musculoskeletal:  Positive for  arthralgias and back pain. Negative for gait problem, joint swelling, myalgias, neck pain and neck stiffness.       Chronic neck, back and rotary cuff pain  Skin:  Negative for color change, pallor, rash and wound.  Neurological:  Negative for dizziness, syncope, speech difficulty, weakness, light-headedness, numbness and headaches.  Hematological:  Does not bruise/bleed easily.  Psychiatric/Behavioral:  Negative for agitation, behavioral problems, confusion, hallucinations and sleep disturbance. The patient is not nervous/anxious.     There is no immunization history on file for this patient. Pertinent  Health Maintenance Due  Topic Date Due   COLONOSCOPY (Pts 45-51yrs Insurance coverage will need to be confirmed)  Never done   MAMMOGRAM  Never done   DEXA SCAN  Never done   INFLUENZA VACCINE  Never done      02/06/2022   10:51 PM 02/07/2022    8:00 AM 02/07/2022    8:00 PM 02/08/2022    9:00 AM 02/21/2022   10:30 AM  Fall Risk  Falls in the past year?     0  Was there an injury with Fall?     0  Fall Risk Category Calculator     0  Fall Risk Category     Low  Patient Fall Risk Level Moderate fall risk High fall risk Moderate fall risk Moderate fall risk Low fall risk  Patient at Risk for Falls Due to     No Fall Risks  Fall risk Follow up     Falls evaluation completed   Functional Status Survey:    Vitals:   02/21/22 1026  BP: (!) 160/102  Pulse: 92  Resp: 16  Temp: 97.7 F (36.5 C)  SpO2: 98%  Weight: 156 lb 6.4 oz (70.9 kg)  Height: 4\' 10"  (1.473 m)   Body mass index is 32.69 kg/m. Physical Exam Vitals reviewed.  Constitutional:      General: She is not in acute distress.    Appearance: Normal appearance. She is obese. She is not ill-appearing or diaphoretic.  HENT:     Head: Normocephalic.     Right Ear: Tympanic membrane, ear canal and external ear normal. There is no impacted cerumen.     Left Ear: Tympanic membrane, ear canal and external ear normal. There is  no impacted cerumen.     Nose: Nose normal. No congestion or rhinorrhea.     Mouth/Throat:     Mouth: Mucous membranes are moist.     Pharynx: Oropharynx is clear. No oropharyngeal exudate or posterior oropharyngeal erythema.  Eyes:     General: No scleral icterus.       Right eye: No discharge.        Left eye: No discharge.     Extraocular Movements: Extraocular movements intact.     Conjunctiva/sclera: Conjunctivae normal.     Pupils: Pupils are equal, round, and reactive to light.  Neck:     Vascular: No carotid bruit.  Cardiovascular:     Rate and Rhythm: Normal rate and  regular rhythm.     Pulses: Normal pulses.     Heart sounds: Normal heart sounds. No murmur heard.   No friction rub. No gallop.  Pulmonary:     Effort: Pulmonary effort is normal. No respiratory distress.     Breath sounds: Normal breath sounds. No wheezing, rhonchi or rales.  Chest:     Chest wall: No tenderness.  Abdominal:     General: Bowel sounds are normal. There is no distension.     Palpations: Abdomen is soft. There is no mass.     Tenderness: There is no abdominal tenderness. There is no right CVA tenderness, left CVA tenderness, guarding or rebound.  Musculoskeletal:        General: No swelling or tenderness. Normal range of motion.     Cervical back: Normal range of motion. No rigidity or tenderness.     Right lower leg: No edema.     Left lower leg: No edema.  Lymphadenopathy:     Cervical: No cervical adenopathy.  Skin:    General: Skin is warm and dry.     Coloration: Skin is not pale.     Findings: No bruising, erythema, lesion or rash.  Neurological:     Mental Status: She is alert and oriented to person, place, and time.     Cranial Nerves: No cranial nerve deficit.     Sensory: No sensory deficit.     Motor: No weakness.     Coordination: Coordination normal.     Gait: Gait normal.  Psychiatric:        Mood and Affect: Mood normal.        Speech: Speech normal.         Behavior: Behavior normal.        Thought Content: Thought content normal.        Judgment: Judgment normal.    Labs reviewed: Recent Labs    02/06/22 2257 02/21/22 1151  NA 142 142  K 3.3* 4.5  CL 105 107  CO2 26 25  GLUCOSE 106* 94  BUN 23 19  CREATININE 1.16* 0.85  CALCIUM 9.6 9.7   Recent Labs    02/06/22 2257 02/21/22 1151  AST 25 17  ALT 17 14  ALKPHOS 42  --   BILITOT 0.6 0.8  PROT 7.7 7.5  ALBUMIN 4.0  --    Recent Labs    02/06/22 2257 02/21/22 1151  WBC 8.9 6.6  NEUTROABS 6.2 4,217  HGB 15.0 15.2  HCT 46.2* 45.1*  MCV 89.7 88.4  PLT 314 322   Lab Results  Component Value Date   TSH 1.55 02/21/2022   Lab Results  Component Value Date   HGBA1C 5.8 (H) 02/07/2022   Lab Results  Component Value Date   CHOL 196 02/07/2022   HDL 34 (L) 02/07/2022   LDLCALC 117 (H) 02/07/2022   TRIG 223 (H) 02/07/2022   CHOLHDL 5.8 02/07/2022    Significant Diagnostic Results in last 30 days:  MR ANGIO HEAD WO CONTRAST  Result Date: 02/08/2022 CLINICAL DATA:  Slurred speech, question stroke EXAM: MRA HEAD WITHOUT CONTRAST TECHNIQUE: Angiographic images of the Circle of Willis were acquired using MRA technique without intravenous contrast. COMPARISON:  Brain MRI from yesterday FINDINGS: Anterior circulation: Atheromatous plaque along the carotid siphons without flow limiting stenosis. Diffuse atheromatous irregularity of medium size vessels with high-grade right A2 and right M2 segment narrowings. Mild narrowing at the right M1 segment. Negative for aneurysm Posterior circulation: Left dominant  vertebral artery. Atheromatous irregularity affecting the vertebral and basilar arteries with high-grade right distal V4 segment stenosis and mild basilar narrowing. Focal high-grade right P2 segment stenosis. Diffuse atheromatous irregularity of the posterior cerebral arteries. Negative for aneurysm Anatomic variants: None significant IMPRESSION: Extensive intracranial  atherosclerosis. Notable high-grade narrowings at the distal right V4 , right M2, right P2, and right A2 segments. Electronically Signed   By: Tiburcio Pea M.D.   On: 02/08/2022 07:08   MR BRAIN WO CONTRAST  Result Date: 02/07/2022 CLINICAL DATA:  TIA, slurred speech EXAM: MRI HEAD WITHOUT CONTRAST TECHNIQUE: Multiplanar, multiecho pulse sequences of the brain and surrounding structures were obtained without intravenous contrast. COMPARISON:  No prior MRI, correlation is made with CT head 02/06/2022 FINDINGS: Brain: No restricted diffusion to suggest acute or subacute infarct. No acute hemorrhage, mass, mass effect, or midline shift. No hydrocephalus or extra-axial collection. T2 hyperintense signal in the periventricular white matter and pons, likely the sequela of moderate chronic small vessel ischemic disease. 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 Punctate foci of hemosiderin deposition in the right basal ganglia, thalamus, and medial pons likely sequela of prior hypertensive microhemorrhages. Vascular: Normal flow voids. Skull and upper cervical spine: Normal marrow signal. Sinuses/Orbits: Negative. Other: The mastoids are well aerated. IMPRESSION: No acute intracranial process. No evidence of acute or subacute infarct. Electronically Signed   By: Wiliam Ke M.D.   On: 02/07/2022 03:27   US Carotid Bilateral  Result Date: 02/07/2022 CLINICAL DATA:  TIA.  History of hypertension. EXAM: BILATERAL CAROTID DUPLEX ULTRASOUND TECHNIQUE: Wallace Cullens scale imaging, color Doppler and duplex ultrasound were performed of bilateral carotid and vertebral arteries in the neck. COMPARISON:  None. FINDINGS: Criteria: Quantification of carotid stenosis is based on velocity parameters that correlate the residual internal carotid diameter with NASCET-based stenosis levels, using the diameter of the distal internal carotid lumen as the denominator for stenosis measurement. The following velocity  measurements were obtained: RIGHT ICA: 99/37 cm/sec CCA: 103/21 cm/sec SYSTOLIC ICA/CCA RATIO:  1.0 ECA: 116 cm/sec LEFT ICA: 79/30 cm/sec CCA: 90/20 cm/sec SYSTOLIC ICA/CCA RATIO:  0.9 ECA: 121 cm/sec RIGHT CAROTID ARTERY: There is a minimal amount intimal thickening/hypoechoic plaque involving the distal aspect the right common carotid artery (image 15), extending to involve the right carotid bulb (image 18). There is a minimal amount of intimal thickening/hypoechoic plaque involving the origin and proximal aspects of the right internal carotid artery (image 24), not resulting in elevated peak systolic velocities within the interrogated course of the right internal carotid artery to suggest a hemodynamically significant stenosis. RIGHT VERTEBRAL ARTERY:  Antegrade Flow LEFT CAROTID ARTERY: There is a very minimal amount of intimal thickening involving the left carotid bulb (image 51), extending to involve the origin and proximal aspects of the left internal carotid artery (image 58), not resulting in elevated peak systolic velocities within the interrogated course of the left internal carotid artery to suggest a hemodynamically significant stenosis. LEFT VERTEBRAL ARTERY:  Antegrade flow IMPRESSION: Minimal amount of bilateral intimal thickening/atherosclerotic plaque, right greater than left, not resulting in a hemodynamically significant stenosis within either internal carotid artery. Electronically Signed   By: Simonne Come M.D.   On: 02/07/2022 11:34   ECHOCARDIOGRAM COMPLETE  Result Date: 02/07/2022    ECHOCARDIOGRAM REPORT   Patient Name:   DANISE RIDDLEY Date of Exam: 02/07/2022 Medical Rec #:  [id number]         Height:       58.0 in  Accession #:    [id number]        Weight:       156.8 lb Date of Birth:  [birth year]         BSA:          1.642 m Patient Age:    65 years          BP:           189/92 mmHg Patient Gender: F                 HR:           74 bpm. Exam Location:  [facility] Procedure: 2D Echo,  Color Doppler and Cardiac Doppler Indications:     I63.9 Stroke  History:         Patient has no prior history of Echocardiogram examinations.                  Signs/Symptoms:inability to speak; Risk Factors:Hypertension.  Sonographer:     Humphrey Rolls Referring Phys:  [id number] Andris Baumann Diagnosing Phys: Sena Slate IMPRESSIONS  1. Left ventricular ejection fraction, by estimation, is 60 to 65%. The left ventricle has normal function. The left ventricle has no regional wall motion abnormalities. There is mild left ventricular hypertrophy. Left ventricular diastolic parameters are consistent with Grade I diastolic dysfunction (impaired relaxation).  2. Right ventricular systolic function is normal. The right ventricular size is normal.  3. The mitral valve is normal in structure. Trivial mitral valve regurgitation. No evidence of mitral stenosis.  4. The aortic valve is normal in structure. Aortic valve regurgitation is not visualized. No aortic stenosis is present. FINDINGS  Left Ventricle: Left ventricular ejection fraction, by estimation, is 60 to 65%. The left ventricle has normal function. The left ventricle has no regional wall motion abnormalities. The left ventricular internal cavity size was normal in size. There is  mild left ventricular hypertrophy. Left ventricular diastolic parameters are consistent with Grade I diastolic dysfunction (impaired relaxation). Right Ventricle: The right ventricular size is normal. Right vetricular wall thickness was not well visualized. Right ventricular systolic function is normal. Left Atrium: Left atrial size was normal in size. Right Atrium: Right atrial size was normal in size. Pericardium: There is no evidence of pericardial effusion. Mitral Valve: The mitral valve is normal in structure. Trivial mitral valve regurgitation. No evidence of mitral valve stenosis. MV peak gradient, 4.8 mmHg. The mean mitral valve gradient is 2.0 mmHg. Tricuspid Valve: The tricuspid valve  is grossly normal. Tricuspid valve regurgitation is not demonstrated. Aortic Valve: The aortic valve is normal in structure. Aortic valve regurgitation is not visualized. No aortic stenosis is present. Aortic valve mean gradient measures 3.0 mmHg. Aortic valve peak gradient measures 5.6 mmHg. Aortic valve area, by VTI measures 2.67 cm. Pulmonic Valve: The pulmonic valve was not well visualized. Pulmonic valve regurgitation is not visualized. Aorta: The aortic root is normal in size and structure. Venous: The inferior vena cava was not well visualized. IAS/Shunts: The interatrial septum was not well visualized.  LEFT VENTRICLE PLAX 2D LVIDd:         4.37 cm   Diastology LVIDs:         3.08 cm   LV e' medial:    3.70 cm/s LV PW:         1.11 cm   LV E/e' medial:  12.5 LV IVS:        0.74 cm   LV e' lateral:  4.03 cm/s LVOT diam:     2.00 cm   LV E/e' lateral: 11.5 LV SV:         66 LV SV Index:   40 LVOT Area:     3.14 cm  RIGHT VENTRICLE RV Basal diam:  3.95 cm LEFT ATRIUM             Index        RIGHT ATRIUM           Index LA diam:        2.20 cm 1.34 cm/m   RA Area:     19.40 cm LA Vol (A2C):   39.5 ml 24.05 ml/m  RA Volume:   59.70 ml  36.35 ml/m LA Vol (A4C):   63.9 ml 38.91 ml/m LA Biplane Vol: 50.8 ml 30.93 ml/m  AORTIC VALVE                    PULMONIC VALVE AV Area (Vmax):    2.90 cm     PV Vmax:       0.81 m/s AV Area (Vmean):   2.83 cm     PV Vmean:      53.500 cm/s AV Area (VTI):     2.67 cm     PV VTI:        0.129 m AV Vmax:           118.00 cm/s  PV Peak grad:  2.6 mmHg AV Vmean:          88.200 cm/s  PV Mean grad:  1.0 mmHg AV VTI:            0.247 m AV Peak Grad:      5.6 mmHg AV Mean Grad:      3.0 mmHg LVOT Vmax:         109.00 cm/s LVOT Vmean:        79.500 cm/s LVOT VTI:          0.210 m LVOT/AV VTI ratio: 0.85  AORTA Ao Root diam: 3.10 cm MITRAL VALVE MV Area (PHT): 4.57 cm    SHUNTS MV Area VTI:   3.26 cm    Systemic VTI:  0.21 m MV Peak grad:  4.8 mmHg    Systemic Diam: 2.00 cm  MV Mean grad:  2.0 mmHg MV Vmax:       1.09 m/s MV Vmean:      63.4 cm/s MV Decel Time: 166 msec MV E velocity: 46.30 cm/s MV A velocity: 98.10 cm/s MV E/A ratio:  0.47 Sena Slate Electronically signed by Sena Slate Signature Date/Time: 02/07/2022/12:58:51 PM    Final    CT HEAD CODE STROKE WO CONTRAST  Result Date: 02/06/2022 CLINICAL DATA:  Code stroke.  Altered mental status, slurred speech EXAM: CT HEAD WITHOUT CONTRAST TECHNIQUE: Contiguous axial images were obtained from the base of the skull through the vertex without intravenous contrast. RADIATION DOSE REDUCTION: This exam was performed according to the departmental dose-optimization program which includes automated exposure control, adjustment of the mA and/or kV according to patient size and/or use of iterative reconstruction technique. COMPARISON:  None. FINDINGS: Brain: No evidence of acute infarction, hemorrhage, cerebral edema, mass, mass effect, or midline shift. Ventricles and sulci are normal for age. No extra-axial fluid collection. Periventricular white matter changes, likely the sequela of chronic small vessel ischemic disease. Remote lacunar infarct in the left pons. Vascular: No hyperdense vessel or unexpected calcification. Skull: Normal. Negative for fracture or  focal lesion. Sinuses/Orbits: No acute finding. Other: The mastoid air cells are well aerated. ASPECTS [facility] Stroke Program Early CT Score) - Ganglionic level infarction (caudate, lentiform nuclei, internal capsule, insula, M1-M3 cortex): 7 - Supraganglionic infarction (M4-M6 cortex): 3 Total score (0-10 with 10 being normal): 10 IMPRESSION: 1. No acute intracranial process. 2. ASPECTS is 10 Code stroke imaging results were communicated on 02/06/2022 at 10:54 pm to provider Derrill Kay via telephone, who verbally acknowledged these results. Electronically Signed   By: Wiliam Ke M.D.   On: 02/06/2022 22:55    Assessment/Plan 1. Encounter to establish care Chart reviewed will  need medical records from previous provider then update immunization.  Recommend hospital follow-up lab work.  2. Hx of TIA (transient ischemic attack) and stroke S/p hospital admission 02/06/2022 to 02/08/2022 for TIA -Continue on aspirin 81 mg and Plavix 75 mg as directed -Advised to take atorvastatin as prescribed Will refer to rheumatologist for follow-up - Ambulatory referral to Neurology  3. Sensorineural hearing loss (SNHL) of both ears Decreased hearing bilaterally.Referred to ENT - Ambulatory referral to ENT  4. Uncontrolled hypertension Uncontrolled blood pressure.  Symptomatic. -Negative exam findings -Will increase amlodipine from 5 mg to 10 mg daily.  Will add losartan 25 mg if still uncontrolled in 2 weeks - amLODipine (NORVASC) 10 MG tablet; Take 1 tablet (10 mg total) by mouth daily.  Dispense: 90 tablet; Refill: 0 - CBC with Differential/Platelet - CMP with eGFR([facility]) - TSH  5. Prediabetes A1c is 5.8 Dietary modification and exercise recommended - TSH  6. Chronic low back pain without sciatica, unspecified back pain laterality Chronic  no numbness or tingling on lower extremities start on Mobic as below and continue Tylenol as needed - meloxicam (MOBIC) 15 MG tablet; Take 1 tablet (15 mg total) by mouth daily.  Dispense: 30 tablet; Refill: 0  Family/ staff Communication: Reviewed plan of care with patient and daughter who verbalized understanding  Labs/tests ordered:  - CBC with Differential/Platelet - CMP with eGFR([facility]) - TSH  Next Appointment : 2 weeks for follow-up blood pressure  Marjory Meints C Cleve Paolillo, NP

## 2022-02-22 LAB — CBC WITH DIFFERENTIAL/PLATELET
Absolute Monocytes: 482 cells/uL (ref 200–950)
Basophils Absolute: 79 cells/uL (ref 0–200)
Basophils Relative: 1.2 %
Eosinophils Absolute: 158 cells/uL (ref 15–500)
Eosinophils Relative: 2.4 %
HCT: 45.1 % — ABNORMAL HIGH (ref 35.0–45.0)
Hemoglobin: 15.2 g/dL (ref 11.7–15.5)
Lymphs Abs: 1663 cells/uL (ref 850–3900)
MCH: 29.8 pg (ref 27.0–33.0)
MCHC: 33.7 g/dL (ref 32.0–36.0)
MCV: 88.4 fL (ref 80.0–100.0)
MPV: 9.5 fL (ref 7.5–12.5)
Monocytes Relative: 7.3 %
Neutro Abs: 4217 cells/uL (ref 1500–7800)
Neutrophils Relative %: 63.9 %
Platelets: 322 10*3/uL (ref 140–400)
RBC: 5.1 10*6/uL (ref 3.80–5.10)
RDW: 12.8 % (ref 11.0–15.0)
Total Lymphocyte: 25.2 %
WBC: 6.6 10*3/uL (ref 3.8–10.8)

## 2022-02-22 LAB — COMPLETE METABOLIC PANEL WITH GFR
AG Ratio: 1.5 (calc) (ref 1.0–2.5)
ALT: 14 U/L (ref 6–29)
AST: 17 U/L (ref 10–35)
Albumin: 4.5 g/dL (ref 3.6–5.1)
Alkaline phosphatase (APISO): 45 U/L (ref 37–153)
BUN: 19 mg/dL (ref 7–25)
CO2: 25 mmol/L (ref 20–32)
Calcium: 9.7 mg/dL (ref 8.6–10.4)
Chloride: 107 mmol/L (ref 98–110)
Creat: 0.85 mg/dL (ref 0.50–1.05)
Globulin: 3 g/dL (calc) (ref 1.9–3.7)
Glucose, Bld: 94 mg/dL (ref 65–99)
Potassium: 4.5 mmol/L (ref 3.5–5.3)
Sodium: 142 mmol/L (ref 135–146)
Total Bilirubin: 0.8 mg/dL (ref 0.2–1.2)
Total Protein: 7.5 g/dL (ref 6.1–8.1)
eGFR: 75 mL/min/{1.73_m2} (ref >=60)

## 2022-02-22 LAB — TSH: TSH: 1.55 mIU/L (ref 0.40–4.50)

## 2022-02-23 DIAGNOSIS — Z8673 Personal history of transient ischemic attack (TIA), and cerebral infarction without residual deficits: Secondary | ICD-10-CM | POA: Insufficient documentation

## 2022-02-23 DIAGNOSIS — M545 Low back pain, unspecified: Secondary | ICD-10-CM | POA: Insufficient documentation

## 2022-02-23 DIAGNOSIS — R7303 Prediabetes: Secondary | ICD-10-CM | POA: Insufficient documentation

## 2022-03-08 ENCOUNTER — Ambulatory Visit: Payer: PPO | Admitting: Family

## 2022-03-18 ENCOUNTER — Ambulatory Visit (INDEPENDENT_AMBULATORY_CARE_PROVIDER_SITE_OTHER): Payer: PPO | Admitting: Family

## 2022-03-18 ENCOUNTER — Encounter: Payer: Self-pay | Admitting: Family

## 2022-03-18 VITALS — BP 150/88 | HR 84 | Temp 97.7°F | Resp 16 | Ht <= 58 in | Wt 158.4 lb

## 2022-03-18 DIAGNOSIS — F321 Major depressive disorder, single episode, moderate: Secondary | ICD-10-CM

## 2022-03-18 DIAGNOSIS — I10 Essential (primary) hypertension: Secondary | ICD-10-CM

## 2022-03-18 DIAGNOSIS — G8929 Other chronic pain: Secondary | ICD-10-CM | POA: Diagnosis not present

## 2022-03-18 DIAGNOSIS — M545 Low back pain, unspecified: Secondary | ICD-10-CM | POA: Diagnosis not present

## 2022-03-18 MED ORDER — DULOXETINE HCL 30 MG PO CPEP: 30.0000 mg | ORAL_CAPSULE | Freq: Every day | ORAL | 0 refills | Status: DC

## 2022-03-18 NOTE — Progress Notes (Signed)
? ?Provider: Marlowe Sax FNP-C ? ?Aleksei Goodlin, Nelda Bucks, NP ? ?Patient Care Team: ?Keyonda Bickle, Nelda Bucks, NP as PCP - General (Family Medicine) ? ?Extended Emergency Contact Information ?Primary Emergency Contact: Coran,Holly ?Mobile Phone: [phone] ?Relation: Daughter ? ?Code Status:  Full Code  ?Goals of care: Advanced Directive information ? ?  03/18/2022  ? 10:17 AM  ?Advanced Directives  ?Does Patient Have a Medical Advance Directive? No  ?Would patient like information on creating a medical advance directive? No - Patient declined  ? ? ? ?Chief Complaint  ?Patient presents with  ? Follow-up  ?  2 week follow up on blood pressure.  ? ? ?HPI:  ?Pt is a 66 y.o. female seen today for an acute visit for follow up high blood pressure.she was here 02/21/2022 B/p was 160/102 Amlodipine was increased from 5 mg to 10 mg daily and advised to follow up today.blood pressure has improved  ?She complains of chronic low back pain and knees from osteoarthritis states unable to walk due to pain in the hips.  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  Feeling down most of the times. Has no energy. ? ?Past Medical History:  ?Diagnosis Date  ? Arthritis   ? Hypertension   ? TIA (transient ischemic attack) 2023  ? Per new patient form  ? ?Past Surgical History:  ?Procedure Laterality Date  ? BACK SURGERY    ? MRI  2023  ? ER doctor; Per new patient form  ? ROTATOR CUFF REPAIR    ? Per new patient form  ? ? ?No Known Allergies ? ?Outpatient Encounter Medications as of 03/18/2022  ?Medication Sig  ? acetaminophen (TYLENOL) 650 MG CR tablet Take 650 mg by mouth every 8 (eight) hours as needed for pain.  ? amLODipine (NORVASC) 10 MG tablet Take 1 tablet (10 mg total) by mouth daily.  ? aspirin 81 MG EC tablet Take 1 tablet (81 mg total) by mouth daily. Swallow whole.  ? clopidogrel (PLAVIX) 75 MG tablet Take 1 tablet (75 mg total) by mouth daily.  ? meloxicam (MOBIC) 15 MG tablet  Take 1 tablet (15 mg total) by mouth daily.  ? ?No facility-administered encounter medications on file as of 03/18/2022.  ? ? ?Review of Systems  ?Constitutional:  Negative for appetite change, chills, fatigue, fever and unexpected weight change.  ?HENT:  Negative for congestion, dental problem, ear discharge, ear pain, facial swelling, hearing loss, nosebleeds, postnasal drip, rhinorrhea, sinus pressure, sinus pain, sneezing, sore throat, tinnitus and trouble swallowing.   ?Eyes:  Negative for pain, discharge, redness, itching and visual disturbance.  ?Respiratory:  Negative for cough, chest tightness, shortness of breath and wheezing.   ?Cardiovascular:  Negative for chest pain, palpitations and leg swelling.  ?Gastrointestinal:  Negative for abdominal distention, abdominal pain, blood in stool, constipation, diarrhea, nausea and vomiting.  ?Genitourinary:  Negative for difficulty urinating, dysuria, flank pain, frequency and urgency.  ?Musculoskeletal:  Negative for arthralgias, back pain, gait problem, joint swelling, myalgias, neck pain and neck stiffness.  ?Skin:  Negative for color change, pallor, rash and wound.  ?Neurological:  Negative for dizziness, syncope, speech difficulty, weakness, light-headedness, numbness and headaches.  ?Hematological:  Does not bruise/bleed easily.  ?Psychiatric/Behavioral:  Negative for agitation, behavioral problems, confusion, hallucinations and sleep disturbance. The patient is not nervous/anxious.   ?     Depressed  ? ? ?There is no immunization history on file for this  patient. ?Pertinent  Health Maintenance Due  ?Topic Date Due  ? COLONOSCOPY (Pts 45-38yrs Insurance coverage will need to be confirmed)  Never done  ? MAMMOGRAM  Never done  ? DEXA SCAN  Never done  ? INFLUENZA VACCINE  07/05/2022  ? ? ?  02/07/2022  ?  8:00 AM 02/07/2022  ?  8:00 PM 02/08/2022  ?  9:00 AM 02/21/2022  ? 10:30 AM 03/18/2022  ? 10:16 AM  ?Fall Risk  ?Falls in the past year?    0 0  ?Was there an injury  with Fall?    0 0  ?Fall Risk Category Calculator    0 0  ?Fall Risk Category    Low Low  ?Patient Fall Risk Level High fall risk Moderate fall risk Moderate fall risk Low fall risk Low fall risk  ?Patient at Risk for Falls Due to    No Fall Risks No Fall Risks  ?Fall risk Follow up    Falls evaluation completed Falls evaluation completed  ? ?Functional Status Survey: ?  ? ?Vitals:  ? 03/18/22 1015  ?BP: (!) 150/88  ?Pulse: 84  ?Resp: 16  ?Temp: 97.7 ?F (36.5 ?C)  ?SpO2: 98%  ?Weight: 158 lb 6.4 oz (71.8 kg)  ?Height: 4\' 10"  (1.473 m)  ? ?Body mass index is 33.11 kg/m?Marland Kitchen ?Physical Exam ?Vitals reviewed.  ?Constitutional:   ?   General: She is not in acute distress. ?   Appearance: Normal appearance. She is normal weight. She is not ill-appearing or diaphoretic.  ?HENT:  ?   Head: Normocephalic.  ?   Mouth/Throat:  ?   Mouth: Mucous membranes are moist.  ?   Pharynx: Oropharynx is clear. No oropharyngeal exudate or posterior oropharyngeal erythema.  ?Eyes:  ?   General: No scleral icterus.    ?   Right eye: No discharge.     ?   Left eye: No discharge.  ?   Extraocular Movements: Extraocular movements intact.  ?   Conjunctiva/sclera: Conjunctivae normal.  ?   Pupils: Pupils are equal, round, and reactive to light.  ?Neck:  ?   Vascular: No carotid bruit.  ?Cardiovascular:  ?   Rate and Rhythm: Normal rate and regular rhythm.  ?   Pulses: Normal pulses.  ?   Heart sounds: Normal heart sounds. No murmur heard. ?  No friction rub. No gallop.  ?Pulmonary:  ?   Effort: Pulmonary effort is normal. No respiratory distress.  ?   Breath sounds: Normal breath sounds. No wheezing, rhonchi or rales.  ?Chest:  ?   Chest wall: No tenderness.  ?Abdominal:  ?   General: Bowel sounds are normal. There is no distension.  ?   Palpations: Abdomen is soft. There is no mass.  ?   Tenderness: There is no abdominal tenderness. There is no right CVA tenderness, left CVA tenderness, guarding or rebound.  ?Musculoskeletal:     ?   General: No  swelling or tenderness. Normal range of motion.  ?   Cervical back: Normal range of motion. No rigidity or tenderness.  ?   Right lower leg: No edema.  ?   Left lower leg: No edema.  ?Lymphadenopathy:  ?   Cervical: No cervical adenopathy.  ?Skin: ?   General: Skin is warm and dry.  ?   Coloration: Skin is not pale.  ?   Findings: No bruising, erythema, lesion or rash.  ?Neurological:  ?   Mental Status: She is alert and  oriented to person, place, and time.  ?   Cranial Nerves: No cranial nerve deficit.  ?   Sensory: No sensory deficit.  ?   Motor: No weakness.  ?   Coordination: Coordination normal.  ?   Gait: Gait normal.  ?Psychiatric:     ?   Mood and Affect: Mood is anxious and depressed.     ?   Speech: Speech normal.     ?   Behavior: Behavior normal.     ?   Thought Content: Thought content normal.     ?   Judgment: Judgment normal.  ? ? ?Labs reviewed: ?Recent Labs  ?  02/06/22 ?2257 02/21/22 ?1151  ?NA 142 142  ?K 3.3* 4.5  ?CL 105 107  ?CO2 26 25  ?GLUCOSE 106* 94  ?BUN 23 19  ?CREATININE 1.16* 0.85  ?CALCIUM 9.6 9.7  ? ?Recent Labs  ?  02/06/22 ?2257 02/21/22 ?1151  ?AST 25 17  ?ALT 17 14  ?ALKPHOS 42  --   ?BILITOT 0.6 0.8  ?PROT 7.7 7.5  ?ALBUMIN 4.0  --   ? ?Recent Labs  ?  02/06/22 ?2257 02/21/22 ?1151  ?WBC 8.9 6.6  ?NEUTROABS 6.2 4,217  ?HGB 15.0 15.2  ?HCT 46.2* 45.1*  ?MCV 89.7 88.4  ?PLT 314 322  ? ?Lab Results  ?Component Value Date  ? TSH 1.55 02/21/2022  ? ?Lab Results  ?Component Value Date  ? HGBA1C 5.8 (H) 02/07/2022  ? ?Lab Results  ?Component Value Date  ? CHOL 196 02/07/2022  ? HDL 34 (L) 02/07/2022  ? LDLCALC 117 (H) 02/07/2022  ? TRIG 223 (H) 02/07/2022  ? CHOLHDL 5.8 02/07/2022  ? ? ?Significant Diagnostic Results in last 30 days:  ?No results found. ? ?Assessment/Plan ? ?1. Essential hypertension ?Blood pressure has improved compared to previous visit.  Has been taking amlodipine as advised ?Continue on amlodipine 10 mg daily ?-Advised to monitor blood pressure at home and notify  provider if it is consistently above 140/90 we will consider starting on losartan if blood pressure still not under control. ? ?2. Chronic low back pain without sciatica, unspecified back pain laterali

## 2022-03-18 NOTE — Patient Instructions (Signed)
Duloxetine Delayed-Release Capsules ?What is this medication? ?DULOXETINE (doo LOX e teen) treats depression, anxiety, fibromyalgia, and certain types of chronic pain such as nerve, bone, or joint pain. It increases the amount of serotonin and norepinephrine in the brain, hormones that help regulate mood and pain. It belongs to a group of medications called SNRIs. ?This medicine may be used for other purposes; ask your health care provider or pharmacist if you have questions. ?COMMON BRAND NAME(S): Cymbalta, Drizalma, Irenka ?What should I tell my care team before I take this medication? ?They need to know if you have any of these conditions: ?Bipolar disorder ?Glaucoma ?High blood pressure ?Kidney disease ?Liver disease ?Seizures ?Suicidal thoughts, plans or attempt; a previous suicide attempt by you or a family member ?Take medications that treat or prevent blood clots ?Taken medications called MAOIs like Carbex, Eldepryl, Marplan, Nardil, and Parnate within 14 days ?Trouble passing urine ?An unusual reaction to duloxetine, other medications, foods, dyes, or preservatives ?Pregnant or trying to get pregnant ?Breast-feeding ?How should I use this medication? ?Take this medication by mouth with a glass of water. Follow the directions on the prescription label. Do not crush, cut or chew some capsules of this medication. Some capsules may be opened and sprinkled on applesauce. Check with your care team or pharmacist if you are not sure. You can take this medication with or without food. Take your medication at regular intervals. Do not take your medication more often than directed. Do not stop taking this medication suddenly except upon the advice of your care team. Stopping this medication too quickly may cause serious side effects or your condition may worsen. ?A special MedGuide will be given to you by the pharmacist with each prescription and refill. Be sure to read this information carefully each time. ?Talk to  your care team regarding the use of this medication in children. While this medication may be prescribed for children as young as 7 years of age for selected conditions, precautions do apply. ?Overdosage: If you think you have taken too much of this medicine contact a poison control center or emergency room at once. ?NOTE: This medicine is only for you. Do not share this medicine with others. ?What if I miss a dose? ?If you miss a dose, take it as soon as you can. If it is almost time for your next dose, take only that dose. Do not take double or extra doses. ?What may interact with this medication? ?Do not take this medication with any of the following: ?Desvenlafaxine ?Levomilnacipran ?Linezolid ?MAOIs like Carbex, Eldepryl, Emsam, Marplan, Nardil, and Parnate ?Methylene blue (injected into a vein) ?Milnacipran ?Safinamide ?Thioridazine ?Venlafaxine ?Viloxazine ?This medication may also interact with the following: ?Alcohol ?Amphetamines ?Aspirin and aspirin-like medications ?Certain antibiotics like ciprofloxacin and enoxacin ?Certain medications for blood pressure, heart disease, irregular heart beat ?Certain medications for depression, anxiety, or psychotic disturbances ?Certain medications for migraine headache like almotriptan, eletriptan, frovatriptan, naratriptan, rizatriptan, sumatriptan, zolmitriptan ?Certain medications that treat or prevent blood clots like warfarin, enoxaparin, and dalteparin ?Cimetidine ?Fentanyl ?Lithium ?NSAIDS, medications for pain and inflammation, like ibuprofen or naproxen ?Phentermine ?Procarbazine ?Rasagiline ?Sibutramine ?St. John's wort ?Theophylline ?Tramadol ?Tryptophan ?This list may not describe all possible interactions. Give your health care provider a list of all the medicines, herbs, non-prescription drugs, or dietary supplements you use. Also tell them if you smoke, drink alcohol, or use illegal drugs. Some items may interact with your medicine. ?What should I watch  for while using this medication? ?Tell your   care team if your symptoms do not get better or if they get worse. Visit your care team for regular checks on your progress. Because it may take several weeks to see the full effects of this medication, it is important to continue your treatment as prescribed by your care team. ?This medication may cause serious skin reactions. They can happen weeks to months after starting the medication. Contact your care team right away if you notice fevers or flu-like symptoms with a rash. The rash may be red or purple and then turn into blisters or peeling of the skin. Or, you might notice a red rash with swelling of the face, lips, or lymph nodes in your neck or under your arms. ?Watch for new or worsening thoughts of suicide or depression. This includes sudden changes in mood, behaviors, or thoughts. These changes can happen at any time but are more common in the beginning of treatment or after a change in dose. Call your care team right away if you experience these thoughts or worsening depression. ?Manic episodes may happen in patients with bipolar disorder who take this medication. Watch for changes in feelings or behaviors such as feeling anxious, nervous, agitated, panicky, irritable, hostile, aggressive, impulsive, severely restless, overly excited and hyperactive, or trouble sleeping. These symptoms can happen at any time, but are more common in the beginning of treatment or after a change in dose. Call your care team right away if you notice any of these symptoms. ?You may get drowsy or dizzy. Do not drive, use machinery, or do anything that needs mental alertness until you know how this medication affects you. Do not stand or sit up quickly, especially if you are an older patient. This reduces the risk of dizzy or fainting spells. Alcohol may interfere with the effect of this medication. Avoid alcoholic drinks. ?This medication may increase blood sugar. The risk may be  higher in patients who already have diabetes. Ask your care team what you can do to lower your risk of diabetes while taking this medication. ?This medication can cause an increase in blood pressure. This medication can also cause a sudden drop in your blood pressure, which may make you feel faint and increase the chance of a fall. These effects are most common when you first start the medication or when the dose is increased, or during use of other medications that can cause a sudden drop in blood pressure. Check with your care team for instructions on monitoring your blood pressure while taking this medication. ?Your mouth may get dry. Chewing sugarless gum or sucking hard candy, and drinking plenty of water, may help. Contact your care team if the problem does not go away or is severe. ?What side effects may I notice from receiving this medication? ?Side effects that you should report to your care team as soon as possible: ?Allergic reactions--skin rash, itching, hives, swelling of the face, lips, tongue, or throat ?Bleeding--bloody or black, tar-like stools, red or dark brown urine, vomiting blood or brown material that looks like coffee grounds, small, red or purple spots on skin, unusual bleeding or bruising ?Increase in blood pressure ?Liver injury--right upper belly pain, loss of appetite, nausea, light-colored stool, dark yellow or brown urine, yellowing skin or eyes, unusual weakness or fatigue ?Low sodium level--muscle weakness, fatigue, dizziness, headache, confusion ?Redness, blistering, peeling, or loosening of the skin, including inside the mouth ?Serotonin syndrome--irritability, confusion, fast or irregular heartbeat, muscle stiffness, twitching muscles, sweating, high fever, seizures, chills, vomiting, diarrhea ?  Sudden eye pain or change in vision such as blurry vision, seeing halos around lights, vision loss ?Thoughts of suicide or self-harm, worsening mood, feelings of depression ?Trouble passing  urine ?Side effects that usually do not require medical attention (report to your care team if they continue or are bothersome): ?Change in sex drive or performance ?Constipation ?Diarrhea ?Dizziness ?Dr

## 2022-03-21 ENCOUNTER — Other Ambulatory Visit: Payer: Self-pay | Admitting: Family

## 2022-03-21 DIAGNOSIS — M545 Low back pain, unspecified: Secondary | ICD-10-CM

## 2022-03-21 NOTE — Telephone Encounter (Signed)
Recommend referral to weight loss clinic for weight management.A 1 C 5.8 indicates prediabetes treatment indicated if A 1 C if  > 6.5  ?

## 2022-03-22 NOTE — Telephone Encounter (Signed)
Please obtain previous medical records from weight loss clinic.  ?

## 2022-04-18 ENCOUNTER — Ambulatory Visit: Payer: PPO | Admitting: Family

## 2022-05-20 ENCOUNTER — Other Ambulatory Visit: Payer: Self-pay | Admitting: Family

## 2022-05-20 DIAGNOSIS — I10 Essential (primary) hypertension: Secondary | ICD-10-CM

## 2022-05-23 ENCOUNTER — Other Ambulatory Visit: Payer: Self-pay | Admitting: *Deleted

## 2022-05-23 DIAGNOSIS — G8929 Other chronic pain: Secondary | ICD-10-CM

## 2022-05-23 DIAGNOSIS — F321 Major depressive disorder, single episode, moderate: Secondary | ICD-10-CM

## 2022-05-23 MED ORDER — DULOXETINE HCL 30 MG PO CPEP: 30.0000 mg | ORAL_CAPSULE | Freq: Every day | ORAL | 1 refills | Status: DC

## 2022-05-23 NOTE — Telephone Encounter (Signed)
Patient called and stated that she needs a refill. [place] haven't been able to get back home due to sickness and car repairs.  Stated that she will make a follow up appointment as soon as she gets home.

## 2022-06-14 ENCOUNTER — Telehealth: Payer: Self-pay | Admitting: *Deleted

## 2022-06-14 NOTE — Telephone Encounter (Signed)
Patient called and stated that she is [place] in [facility] through the end of the summer.   Stated that she is having lower pressure in her abdomin and Urgency Urination. No Fever. Patient feels like she has a UTI and wanting to know if you would call her in an antibiotic since she is out of town.   Please Advise.

## 2022-06-14 NOTE — Telephone Encounter (Signed)
Recommend evaluation at urgent care to rule out urinary tract infection.

## 2022-06-14 NOTE — Telephone Encounter (Signed)
Patient notified and agreed.  

## 2022-06-16 ENCOUNTER — Ambulatory Visit: Payer: PPO | Admitting: Neurology

## 2022-06-17 ENCOUNTER — Ambulatory Visit: Payer: PPO | Admitting: Neurology

## 2022-07-20 ENCOUNTER — Other Ambulatory Visit: Payer: Self-pay | Admitting: Family

## 2022-07-20 DIAGNOSIS — F321 Major depressive disorder, single episode, moderate: Secondary | ICD-10-CM

## 2022-07-20 DIAGNOSIS — M545 Low back pain, unspecified: Secondary | ICD-10-CM

## 2022-08-23 ENCOUNTER — Other Ambulatory Visit: Payer: Self-pay | Admitting: Family

## 2022-08-23 DIAGNOSIS — M545 Low back pain, unspecified: Secondary | ICD-10-CM

## 2022-08-23 NOTE — Telephone Encounter (Signed)
Patient has request refill on medication Meloxicam 15mg . Patient medication last refilled 03/21/2022. Medication pend and sent to PCP Ngetich, Nelda Bucks, NP for approval. Please Advise.

## 2022-08-25 NOTE — Telephone Encounter (Signed)
Appears she was supposed to follow up with Dinah, no follow up on file.

## 2022-08-30 ENCOUNTER — Other Ambulatory Visit: Payer: Self-pay | Admitting: Family

## 2022-08-30 DIAGNOSIS — I10 Essential (primary) hypertension: Secondary | ICD-10-CM

## 2022-09-05 ENCOUNTER — Other Ambulatory Visit: Payer: Self-pay

## 2022-09-05 DIAGNOSIS — G8929 Other chronic pain: Secondary | ICD-10-CM

## 2022-09-05 MED ORDER — MELOXICAM 15 MG PO TABS: 15.0000 mg | ORAL_TABLET | Freq: Every day | ORAL | 1 refills | Status: DC

## 2022-09-05 NOTE — Telephone Encounter (Signed)
Patient called requesting refill on meloxicam. Patient would like prescription sent to [facility] in [place].  Medication pended and sent to Marlowe Sax, NP

## 2022-10-03 ENCOUNTER — Ambulatory Visit: Payer: PPO | Admitting: Family

## 2022-11-08 ENCOUNTER — Other Ambulatory Visit: Payer: Self-pay | Admitting: Family

## 2022-11-08 DIAGNOSIS — M545 Low back pain, unspecified: Secondary | ICD-10-CM

## 2022-11-15 ENCOUNTER — Ambulatory Visit: Payer: PPO | Admitting: Neurology

## 2022-11-23 ENCOUNTER — Other Ambulatory Visit: Payer: Self-pay | Admitting: Family

## 2022-11-23 DIAGNOSIS — I10 Essential (primary) hypertension: Secondary | ICD-10-CM

## 2023-01-30 ENCOUNTER — Telehealth: Payer: Self-pay | Admitting: *Deleted

## 2023-01-30 DIAGNOSIS — F321 Major depressive disorder, single episode, moderate: Secondary | ICD-10-CM

## 2023-01-30 DIAGNOSIS — I10 Essential (primary) hypertension: Secondary | ICD-10-CM

## 2023-01-30 DIAGNOSIS — M545 Low back pain, unspecified: Secondary | ICD-10-CM

## 2023-01-30 MED ORDER — MELOXICAM 15 MG PO TABS: 15.0000 mg | ORAL_TABLET | Freq: Every day | ORAL | 3 refills | Status: DC

## 2023-01-30 MED ORDER — AMLODIPINE BESYLATE 10 MG PO TABS: 10.0000 mg | ORAL_TABLET | Freq: Every day | ORAL | 1 refills | Status: DC

## 2023-01-30 MED ORDER — DULOXETINE HCL 30 MG PO CPEP: 30.0000 mg | ORAL_CAPSULE | Freq: Every day | ORAL | 1 refills | Status: DC

## 2023-01-30 NOTE — Telephone Encounter (Signed)
Patient called back and stated that she needs her Amlodipine, Duloxetine and Meloxicam.   Pended and sent to [facility] for approval.

## 2023-01-30 NOTE — Telephone Encounter (Signed)
Patient called and left message on clinical intake that she is broke down at [facility] Her car is in the shop and she is needing some refills sent to her pharmacy.   I called patient and LMOM to return call regarding which Rx's she is requesting. Awaiting callback.

## 2023-01-30 NOTE — Telephone Encounter (Signed)
Medication refilled as requested.

## 2023-06-15 ENCOUNTER — Other Ambulatory Visit: Payer: Self-pay

## 2023-06-15 DIAGNOSIS — I10 Essential (primary) hypertension: Secondary | ICD-10-CM

## 2023-06-15 MED ORDER — AMLODIPINE BESYLATE 10 MG PO TABS: 10.0000 mg | ORAL_TABLET | Freq: Every day | ORAL | 1 refills | Status: DC

## 2023-06-16 ENCOUNTER — Encounter: Payer: Self-pay | Admitting: Adult Health

## 2023-06-16 ENCOUNTER — Ambulatory Visit (INDEPENDENT_AMBULATORY_CARE_PROVIDER_SITE_OTHER): Payer: PPO | Admitting: Adult Health

## 2023-06-16 VITALS — BP 132/88 | HR 83 | Temp 98.4°F | Resp 18 | Ht <= 58 in | Wt 143.6 lb

## 2023-06-16 DIAGNOSIS — G459 Transient cerebral ischemic attack, unspecified: Secondary | ICD-10-CM

## 2023-06-16 DIAGNOSIS — M545 Low back pain, unspecified: Secondary | ICD-10-CM

## 2023-06-16 DIAGNOSIS — I10 Essential (primary) hypertension: Secondary | ICD-10-CM

## 2023-06-16 DIAGNOSIS — F339 Major depressive disorder, recurrent, unspecified: Secondary | ICD-10-CM | POA: Diagnosis not present

## 2023-06-16 DIAGNOSIS — F321 Major depressive disorder, single episode, moderate: Secondary | ICD-10-CM

## 2023-06-16 DIAGNOSIS — G8929 Other chronic pain: Secondary | ICD-10-CM

## 2023-06-16 DIAGNOSIS — R7303 Prediabetes: Secondary | ICD-10-CM

## 2023-06-16 DIAGNOSIS — H903 Sensorineural hearing loss, bilateral: Secondary | ICD-10-CM

## 2023-06-16 DIAGNOSIS — Z1231 Encounter for screening mammogram for malignant neoplasm of breast: Secondary | ICD-10-CM

## 2023-06-16 DIAGNOSIS — Z1211 Encounter for screening for malignant neoplasm of colon: Secondary | ICD-10-CM

## 2023-06-16 MED ORDER — MELOXICAM 15 MG PO TABS: 15.0000 mg | ORAL_TABLET | Freq: Every day | ORAL | 3 refills | Status: DC

## 2023-06-16 MED ORDER — AMLODIPINE BESYLATE 10 MG PO TABS: 10.0000 mg | ORAL_TABLET | Freq: Every day | ORAL | 3 refills | Status: DC

## 2023-06-16 MED ORDER — DULOXETINE HCL 30 MG PO CPEP: 30.0000 mg | ORAL_CAPSULE | Freq: Every day | ORAL | 3 refills | Status: DC

## 2023-06-16 NOTE — Progress Notes (Addendum)
[facility] clinic  Provider:  Kenard Gower DNP  Code Status:  Full Code  Goals of Care:     03/18/2022   10:17 AM  Advanced Directives  Does Patient Have a Medical Advance Directive? No  Would patient like information on creating a medical advance directive? No - Patient declined     Chief Complaint  Patient presents with   Follow-up    Routine follow up    Quality Metric Gaps    Needs to discuss Medicare Annual Wellness, Hepatitis C Screening, Dtap, Colonoscopy, Mammogram, Dexa Scan, Shingrix, Pneumonia, and Covid vaccine.    HPI: Patient is a 67 y.o. female seen today for medical management of chronic issues.  Prediabetes -  A1C 5.8 (02/07/22), not taking medication  TIA (transient ischemic attack)  -  stable, takes ASA 81 mg EC daily  Primary hypertension -  BP 132/88, takes Amlodipine, does not check BPs at home  Major depression, recurrent, chronic (HCC) -  "feels OK", takes Cymbalta 30 mg daily  Past Medical History:  Diagnosis Date   Arthritis    Hypertension    TIA (transient ischemic attack) 2023   Per new patient form    Past Surgical History:  Procedure Laterality Date   BACK SURGERY     MRI  2023   ER doctor; Per new patient form   ROTATOR CUFF REPAIR     Per new patient form    No Known Allergies  Outpatient Encounter Medications as of 06/16/2023  Medication Sig   acetaminophen (TYLENOL) 650 MG CR tablet Take 650 mg by mouth every 8 (eight) hours as needed for pain.   aspirin 81 MG EC tablet Take 1 tablet (81 mg total) by mouth daily. Swallow whole.   [DISCONTINUED] amLODipine (NORVASC) 10 MG tablet Take 1 tablet (10 mg total) by mouth daily. Needs an appointment before anymore future refills.   [DISCONTINUED] clopidogrel (PLAVIX) 75 MG tablet Take 1 tablet (75 mg total) by mouth daily.   [DISCONTINUED] DULoxetine (CYMBALTA) 30 MG capsule Take 1 capsule (30 mg total) by mouth daily.   [DISCONTINUED] meloxicam (MOBIC) 15 MG tablet Take 1  tablet (15 mg total) by mouth daily.   amLODipine (NORVASC) 10 MG tablet Take 1 tablet (10 mg total) by mouth daily. Needs an appointment before anymore future refills.   DULoxetine (CYMBALTA) 30 MG capsule Take 1 capsule (30 mg total) by mouth daily.   meloxicam (MOBIC) 15 MG tablet Take 1 tablet (15 mg total) by mouth daily.   [DISCONTINUED] meloxicam (MOBIC) 15 MG tablet Take 1 tablet (15 mg total) by mouth daily.   [DISCONTINUED] meloxicam (MOBIC) 15 MG tablet Take 1 tablet (15 mg total) by mouth daily.   No facility-administered encounter medications on file as of 06/16/2023.    Review of Systems:  Review of Systems  Constitutional:  Negative for appetite change, chills, fatigue and fever.  HENT:  Positive for hearing loss. Negative for congestion, rhinorrhea and sore throat.   Eyes: Negative.   Respiratory:  Negative for cough, shortness of breath and wheezing.   Cardiovascular:  Negative for chest pain, palpitations and leg swelling.  Gastrointestinal:  Negative for abdominal pain, constipation, diarrhea, nausea and vomiting.  Genitourinary:  Negative for dysuria.  Musculoskeletal:  Positive for back pain. Negative for arthralgias and myalgias.  Skin:  Negative for color change, rash and wound.  Neurological:  Negative for dizziness, weakness and headaches.  Psychiatric/Behavioral:  Negative for behavioral problems. The patient is not nervous/anxious.  Health Maintenance  Topic Date Due   Medicare Annual Wellness (AWV)  Never done   Hepatitis C Screening  Never done   DTaP/Tdap/Td (1 - Tdap) Never done   Colonoscopy  Never done   MAMMOGRAM  Never done   Zoster Vaccines- Shingrix (1 of 2) Never done   Pneumonia Vaccine 28+ Years old (1 of 1 - PCV) Never done   DEXA SCAN  Never done   COVID-19 Vaccine (1 - 2023-24 season) Never done   INFLUENZA VACCINE  07/06/2023   HPV VACCINES  Aged Out    Physical Exam: Vitals:   06/16/23 1322  BP: 132/88  Pulse: 83  Resp: 18   Temp: 98.4 F (36.9 C)  SpO2: 98%  Weight: 143 lb 9.6 oz (65.1 kg)  Height: 4\' 10"  (1.473 m)   Body mass index is 30.01 kg/m. Physical Exam Constitutional:      Appearance: She is obese.  HENT:     Head: Normocephalic and atraumatic.     Nose: Nose normal.     Mouth/Throat:     Mouth: Mucous membranes are moist.  Eyes:     Conjunctiva/sclera: Conjunctivae normal.  Cardiovascular:     Rate and Rhythm: Normal rate and regular rhythm.  Pulmonary:     Effort: Pulmonary effort is normal.     Breath sounds: Normal breath sounds.  Abdominal:     General: Bowel sounds are normal.     Palpations: Abdomen is soft.  Musculoskeletal:        General: Normal range of motion.     Cervical back: Normal range of motion.  Skin:    General: Skin is warm and dry.  Neurological:     General: No focal deficit present.     Mental Status: She is alert and oriented to person, place, and time.  Psychiatric:        Mood and Affect: Mood normal.        Behavior: Behavior normal.        Thought Content: Thought content normal.        Judgment: Judgment normal.     Labs reviewed: Basic Metabolic Panel: No results for input(s): "NA", "K", "CL", "CO2", "GLUCOSE", "BUN", "CREATININE", "CALCIUM", "MG", "PHOS", "TSH" in the last 8760 hours. Liver Function Tests: No results for input(s): "AST", "ALT", "ALKPHOS", "BILITOT", "PROT", "ALBUMIN" in the last 8760 hours. No results for input(s): "LIPASE", "AMYLASE" in the last 8760 hours. No results for input(s): "AMMONIA" in the last 8760 hours. CBC: No results for input(s): "WBC", "NEUTROABS", "HGB", "HCT", "MCV", "PLT" in the last 8760 hours. Lipid Panel: No results for input(s): "CHOL", "HDL", "LDLCALC", "TRIG", "CHOLHDL", "LDLDIRECT" in the last 8760 hours. Lab Results  Component Value Date   HGBA1C 5.8 (H) 02/07/2022    Procedures since last visit: No results found.  Assessment/Plan  1. Prediabetes Lab Results  Component Value Date    HGBA1C 5.8 (H) 02/07/2022   -  not on medications  2. TIA (transient ischemic attack) -  stable -  continue ASA EC 81 mg daily  3. Primary hypertension -  BP 132/88, stable - amLODipine (NORVASC) 10 MG tablet; Take 1 tablet (10 mg total) by mouth daily. Needs an appointment before anymore future refills.  Dispense: 30 tablet; Refill: 3 -  check BPs at home, log and bring to next appointment  4. Major depression, recurrent, chronic (HCC) -  sister recently died -  -  mood is stable - DULoxetine (CYMBALTA) 30 MG capsule;  Take 1 capsule (30 mg total) by mouth daily.  Dispense: 30 capsule; Refill: 3  5. Screen for colon cancer -  denies blood in stool - Cologuard  6. Chronic bilateral low back pain without sciatica -  stated that she is in pain when she stands up and move, 10/10 -  she [place] came from the beach and able to walk 10 miles -  continue Mobic and Cymbalta - DULoxetine (CYMBALTA) 30 MG capsule; Take 1 capsule (30 mg total) by mouth daily.  Dispense: 30 capsule; Refill: 3 - meloxicam (MOBIC) 15 MG tablet; Take 1 tablet (15 mg total) by mouth daily.  Dispense: 30 tablet; Refill: 3  7. Sensorineural hearing loss (SNHL) of both ears -  requests repeat of inquiries -  was referred to ENT last year but does not recall going to one - Ambulatory referral to ENT  8. Encounter for screening mammogram for malignant neoplasm of breast - MM 3D SCREENING MAMMOGRAM BILATERAL BREAST    Labs/tests ordered:  Cologuard and mammogram  Next appt:  Visit date not found

## 2023-06-16 NOTE — Addendum Note (Signed)
Addended by: Kenard Gower C on: 06/16/2023 04:27 PM   Modules accepted: Orders

## 2023-06-20 ENCOUNTER — Telehealth: Payer: Self-pay

## 2023-06-20 NOTE — Telephone Encounter (Signed)
Patient called and requested update on medication for pain that was suppose to be sent in during appointment 06/16/2023. I called patient back but I was unable to contact patient.

## 2023-06-21 NOTE — Telephone Encounter (Signed)
Patient called again and voicemail cannot be left.

## 2023-06-22 ENCOUNTER — Encounter: Payer: Self-pay | Admitting: Adult Health

## 2023-06-22 ENCOUNTER — Other Ambulatory Visit: Payer: Self-pay | Admitting: Adult Health

## 2023-06-22 DIAGNOSIS — G8929 Other chronic pain: Secondary | ICD-10-CM

## 2023-06-22 MED ORDER — MELOXICAM 15 MG PO TABS: 15.0000 mg | ORAL_TABLET | Freq: Every day | ORAL | 3 refills | Status: DC

## 2023-06-23 NOTE — Telephone Encounter (Signed)
Patient medication was sent into pharmacy.

## 2023-08-08 ENCOUNTER — Telehealth: Payer: Self-pay

## 2023-08-08 NOTE — Telephone Encounter (Signed)
Patient called and left message on clinical intake voicemail stating that she is having a lot of pressure feeling like she has to urinate. She states that she is unable to come in due to transportation issues. She wanted to know if she could have something sent to the pharmacy.  I tried calling patient to get more information and to let her know that we will need to get a urine sample from her in order to prescribe anything, but no answer and unable to leave message due to voice mailbox not set up.

## 2023-08-08 NOTE — Telephone Encounter (Signed)
Need to collect urine specimen prior to prescribing medication.may schedule for video visit then daughter can drop urine to office.

## 2023-08-09 NOTE — Telephone Encounter (Signed)
I tried calling patient, but no answer and no voicemail setup.I have also sent a mychart message

## 2023-08-22 ENCOUNTER — Telehealth: Payer: Self-pay

## 2023-08-22 NOTE — Telephone Encounter (Signed)
Patient called requesting an appointment for possible UTI and blood work. I tried to call patient x 2, call would not go through, and no voicemail. I sent patient a mychart message suggesting she call and select option to schedule an appointment as we have Hazle Nordmann, NP that could see her today

## 2023-08-23 NOTE — Telephone Encounter (Signed)
I tried to call patient again and call would not go through, recording stated " The person you are trying to call has a voicemail that has not been set up, please try your call again later."

## 2023-09-05 ENCOUNTER — Telehealth: Payer: Self-pay

## 2023-09-05 NOTE — Telephone Encounter (Signed)
Message left on clinical intake voicemail:   Patient would like to know when she is due for labs  Please advise

## 2023-09-05 NOTE — Telephone Encounter (Signed)
Outgoing call placed to patient, no answer, and no voicemail. We will need to make additional attempts to reach patient.

## 2023-09-05 NOTE — Telephone Encounter (Signed)
Please schedule routine visit appointment with me and come fasting so that we can do lab work. I do not see any appointment since your last visit July,2024 with Monina-Vargus,NP.

## 2023-09-07 NOTE — Telephone Encounter (Signed)
Outgoing call placed to patient, detailed voicemail left with Ngetich, Dinah C, NP response

## 2023-09-08 ENCOUNTER — Encounter: Payer: Self-pay | Admitting: Adult Health

## 2023-09-08 ENCOUNTER — Ambulatory Visit (INDEPENDENT_AMBULATORY_CARE_PROVIDER_SITE_OTHER): Payer: PPO | Admitting: Adult Health

## 2023-09-08 VITALS — BP 152/98 | HR 97 | Temp 97.7°F | Resp 17 | Ht <= 58 in | Wt 149.4 lb

## 2023-09-08 DIAGNOSIS — F339 Major depressive disorder, recurrent, unspecified: Secondary | ICD-10-CM

## 2023-09-08 DIAGNOSIS — R7303 Prediabetes: Secondary | ICD-10-CM | POA: Diagnosis not present

## 2023-09-08 DIAGNOSIS — I10 Essential (primary) hypertension: Secondary | ICD-10-CM

## 2023-09-08 DIAGNOSIS — R300 Dysuria: Secondary | ICD-10-CM | POA: Diagnosis not present

## 2023-09-08 DIAGNOSIS — E782 Mixed hyperlipidemia: Secondary | ICD-10-CM | POA: Diagnosis not present

## 2023-09-08 DIAGNOSIS — G8929 Other chronic pain: Secondary | ICD-10-CM

## 2023-09-08 DIAGNOSIS — Z2821 Immunization not carried out because of patient refusal: Secondary | ICD-10-CM

## 2023-09-08 DIAGNOSIS — M545 Other chronic pain: Secondary | ICD-10-CM

## 2023-09-08 LAB — POCT URINALYSIS DIP (MANUAL ENTRY)
Bilirubin, UA: NEGATIVE
Blood, UA: NEGATIVE
Glucose, UA: NEGATIVE mg/dL
Ketones, POC UA: NEGATIVE mg/dL
Nitrite, UA: NEGATIVE
Protein Ur, POC: NEGATIVE mg/dL
Spec Grav, UA: <=1.005 — AB (ref 1.010–1.025)
Urobilinogen, UA: 0.2 U/dL
pH, UA: 8 (ref 5.0–8.0)

## 2023-09-08 MED ORDER — AMLODIPINE BESYLATE 10 MG PO TABS: 10.0000 mg | ORAL_TABLET | Freq: Every day | ORAL | 1 refills | Status: DC

## 2023-09-08 MED ORDER — DULOXETINE HCL 30 MG PO CPEP: 30.0000 mg | ORAL_CAPSULE | Freq: Every day | ORAL | 1 refills | Status: DC

## 2023-09-08 MED ORDER — MELOXICAM 15 MG PO TABS: 15.0000 mg | ORAL_TABLET | Freq: Every day | ORAL | 1 refills | Status: AC

## 2023-09-08 MED ORDER — HYDROCHLOROTHIAZIDE 12.5 MG PO TABS: 12.5000 mg | ORAL_TABLET | Freq: Every day | ORAL | 1 refills | Status: DC

## 2023-09-08 NOTE — Progress Notes (Unsigned)
[facility] clinic  Provider:   Code Status: *** Goals of Care:     03/18/2022   10:17 AM  Advanced Directives  Does Patient Have a Medical Advance Directive? No  Would patient like information on creating a medical advance directive? No - Patient declined     Chief Complaint  Patient presents with  . Medical Management of Chronic Issues    Patient is being seen for a 3 month follow up  . Immunizations    Patient is due for immunizations   . Health Maintenance    Patient is due for AWV, Bone density and     HPI: Patient is a 67 y.o. female seen today for an acute visit for  Past Medical History:  Diagnosis Date  . Arthritis   . Hypertension   . TIA (transient ischemic attack) 2023   Per new patient form    Past Surgical History:  Procedure Laterality Date  . BACK SURGERY    . MRI  2023   ER doctor; Per new patient form  . ROTATOR CUFF REPAIR     Per new patient form    No Known Allergies  Outpatient Encounter Medications as of 09/08/2023  Medication Sig  . acetaminophen (TYLENOL) 650 MG CR tablet Take 650 mg by mouth every 8 (eight) hours as needed for pain.  Marland Kitchen amLODipine (NORVASC) 10 MG tablet Take 1 tablet (10 mg total) by mouth daily. Needs an appointment before anymore future refills.  Marland Kitchen aspirin 81 MG EC tablet Take 1 tablet (81 mg total) by mouth daily. Swallow whole.  . DULoxetine (CYMBALTA) 30 MG capsule Take 1 capsule (30 mg total) by mouth daily.  . meloxicam (MOBIC) 15 MG tablet Take 1 tablet (15 mg total) by mouth daily. Take with food.   No facility-administered encounter medications on file as of 09/08/2023.    Review of Systems:  Review of Systems  Constitutional:  Negative for appetite change, chills, fatigue and fever.  HENT:  Negative for congestion, hearing loss, rhinorrhea and sore throat.   Eyes: Negative.   Respiratory:  Negative for cough, shortness of breath and wheezing.   Cardiovascular:  Negative for chest pain, palpitations and leg  swelling.  Gastrointestinal:  Negative for abdominal pain, constipation, diarrhea, nausea and vomiting.  Genitourinary:  Negative for dysuria.  Musculoskeletal:  Negative for arthralgias, back pain and myalgias.  Skin:  Negative for color change, rash and wound.  Neurological:  Negative for dizziness, weakness and headaches.  Psychiatric/Behavioral:  Negative for behavioral problems. The patient is not nervous/anxious.     Health Maintenance  Topic Date Due  . Medicare Annual Wellness (AWV)  Never done  . Hepatitis C Screening  Never done  . DTaP/Tdap/Td (1 - Tdap) Never done  . Colonoscopy  Never done  . MAMMOGRAM  Never done  . Zoster Vaccines- Shingrix (1 of 2) Never done  . Pneumonia Vaccine 23+ Years old (1 of 1 - PCV) Never done  . DEXA SCAN  Never done  . INFLUENZA VACCINE  Never done  . COVID-19 Vaccine (1 - 2023-24 season) Never done  . HPV VACCINES  Aged Out    Physical Exam: There were no vitals filed for this visit. There is no height or weight on file to calculate BMI. Physical Exam Constitutional:      General: She is not in acute distress.    Appearance: Normal appearance.  HENT:     Head: Normocephalic and atraumatic.  Nose: Nose normal.     Mouth/Throat:     Mouth: Mucous membranes are moist.  Eyes:     Conjunctiva/sclera: Conjunctivae normal.  Cardiovascular:     Rate and Rhythm: Normal rate and regular rhythm.  Pulmonary:     Effort: Pulmonary effort is normal.     Breath sounds: Normal breath sounds.  Abdominal:     General: Bowel sounds are normal.     Palpations: Abdomen is soft.  Musculoskeletal:        General: Normal range of motion.     Cervical back: Normal range of motion.  Skin:    General: Skin is warm and dry.  Neurological:     General: No focal deficit present.     Mental Status: She is alert and oriented to person, place, and time.  Psychiatric:        Mood and Affect: Mood normal.        Behavior: Behavior normal.         Thought Content: Thought content normal.        Judgment: Judgment normal.    Labs reviewed: Basic Metabolic Panel: No results for input(s): "NA", "K", "CL", "CO2", "GLUCOSE", "BUN", "CREATININE", "CALCIUM", "MG", "PHOS", "TSH" in the last 8760 hours. Liver Function Tests: No results for input(s): "AST", "ALT", "ALKPHOS", "BILITOT", "PROT", "ALBUMIN" in the last 8760 hours. No results for input(s): "LIPASE", "AMYLASE" in the last 8760 hours. No results for input(s): "AMMONIA" in the last 8760 hours. CBC: No results for input(s): "WBC", "NEUTROABS", "HGB", "HCT", "MCV", "PLT" in the last 8760 hours. Lipid Panel: No results for input(s): "CHOL", "HDL", "LDLCALC", "TRIG", "CHOLHDL", "LDLDIRECT" in the last 8760 hours. Lab Results  Component Value Date   HGBA1C 5.8 (H) 02/07/2022    Procedures since last visit: No results found.  Assessment/Plan    Labs/tests ordered:  * No order type specified * Next appt:  Visit date not found

## 2023-09-09 LAB — CBC WITH DIFFERENTIAL/PLATELET
Absolute Monocytes: 518 {cells}/uL (ref 200–950)
Basophils Absolute: 78 {cells}/uL (ref 0–200)
Basophils Relative: 1.1 %
Eosinophils Absolute: 170 {cells}/uL (ref 15–500)
Eosinophils Relative: 2.4 %
HCT: 43.6 % (ref 35.0–45.0)
Hemoglobin: 14.6 g/dL (ref 11.7–15.5)
Lymphs Abs: 1853 {cells}/uL (ref 850–3900)
MCH: 29.8 pg (ref 27.0–33.0)
MCHC: 33.5 g/dL (ref 32.0–36.0)
MCV: 89 fL (ref 80.0–100.0)
MPV: 9.4 fL (ref 7.5–12.5)
Monocytes Relative: 7.3 %
Neutro Abs: 4480 {cells}/uL (ref 1500–7800)
Neutrophils Relative %: 63.1 %
Platelets: 374 10*3/uL (ref 140–400)
RBC: 4.9 10*6/uL (ref 3.80–5.10)
RDW: 12.8 % (ref 11.0–15.0)
Total Lymphocyte: 26.1 %
WBC: 7.1 10*3/uL (ref 3.8–10.8)

## 2023-09-09 LAB — URINE CULTURE
MICRO NUMBER:: 15555153
SPECIMEN QUALITY:: ADEQUATE

## 2023-09-09 LAB — COMPLETE METABOLIC PANEL WITH GFR
AG Ratio: 1.3 (calc) (ref 1.0–2.5)
ALT: 14 U/L (ref 6–29)
AST: 17 U/L (ref 10–35)
Albumin: 4.3 g/dL (ref 3.6–5.1)
Alkaline phosphatase (APISO): 96 U/L (ref 37–153)
BUN: 15 mg/dL (ref 7–25)
CO2: 28 mmol/L (ref 20–32)
Calcium: 9.3 mg/dL (ref 8.6–10.4)
Chloride: 104 mmol/L (ref 98–110)
Creat: 0.71 mg/dL (ref 0.50–1.05)
Globulin: 3.2 g/dL (ref 1.9–3.7)
Glucose, Bld: 101 mg/dL — ABNORMAL HIGH (ref 65–99)
Potassium: 4.1 mmol/L (ref 3.5–5.3)
Sodium: 141 mmol/L (ref 135–146)
Total Bilirubin: 0.6 mg/dL (ref 0.2–1.2)
Total Protein: 7.5 g/dL (ref 6.1–8.1)
eGFR: 93 mL/min/{1.73_m2} (ref >=60)

## 2023-09-09 LAB — HEMOGLOBIN A1C
Hgb A1c MFr Bld: 5.8 %{Hb} — ABNORMAL HIGH (ref <5.7)
Mean Plasma Glucose: 120 mg/dL
eAG (mmol/L): 6.6 mmol/L

## 2023-09-09 LAB — LIPID PANEL
Cholesterol: 226 mg/dL — ABNORMAL HIGH (ref <200)
HDL: 40 mg/dL — ABNORMAL LOW (ref >=50)
LDL Cholesterol (Calc): 142 mg/dL — ABNORMAL HIGH
Non-HDL Cholesterol (Calc): 186 mg/dL — ABNORMAL HIGH (ref <130)
Total CHOL/HDL Ratio: 5.7 (calc) — ABNORMAL HIGH (ref <5.0)
Triglycerides: 289 mg/dL — ABNORMAL HIGH (ref <150)

## 2023-09-12 MED ORDER — AMLODIPINE BESYLATE 10 MG PO TABS: 10.0000 mg | ORAL_TABLET | Freq: Every day | ORAL | Status: DC

## 2024-02-12 ENCOUNTER — Encounter: Admitting: Family

## 2024-02-13 ENCOUNTER — Ambulatory Visit
Admission: RE | Admit: 2024-02-13 | Discharge: 2024-02-13 | Disposition: A | Discharge status: Home / Self Care | Source: Ambulatory Visit | Attending: Family | Admitting: Family

## 2024-02-13 ENCOUNTER — Ambulatory Visit (INDEPENDENT_AMBULATORY_CARE_PROVIDER_SITE_OTHER): Admitting: Family

## 2024-02-13 ENCOUNTER — Encounter: Payer: Self-pay | Admitting: Family

## 2024-02-13 VITALS — BP 138/84 | HR 100 | Temp 97.6°F | Ht <= 58 in | Wt 157.4 lb

## 2024-02-13 DIAGNOSIS — R9431 Abnormal electrocardiogram [ECG] [EKG]: Secondary | ICD-10-CM

## 2024-02-13 DIAGNOSIS — R0609 Other forms of dyspnea: Secondary | ICD-10-CM

## 2024-02-13 DIAGNOSIS — R7303 Prediabetes: Secondary | ICD-10-CM | POA: Diagnosis not present

## 2024-02-13 DIAGNOSIS — G8929 Other chronic pain: Secondary | ICD-10-CM

## 2024-02-13 DIAGNOSIS — R000 Tachycardia, unspecified: Secondary | ICD-10-CM

## 2024-02-13 DIAGNOSIS — F339 Major depressive disorder, recurrent, unspecified: Secondary | ICD-10-CM

## 2024-02-13 DIAGNOSIS — I10 Essential (primary) hypertension: Secondary | ICD-10-CM | POA: Diagnosis not present

## 2024-02-13 DIAGNOSIS — E782 Mixed hyperlipidemia: Secondary | ICD-10-CM

## 2024-02-13 DIAGNOSIS — M545 Low back pain, unspecified: Secondary | ICD-10-CM

## 2024-02-13 MED ORDER — DULOXETINE HCL 30 MG PO CPEP: 30.0000 mg | ORAL_CAPSULE | Freq: Every day | ORAL | 1 refills | Status: DC

## 2024-02-13 MED ORDER — HYDROCHLOROTHIAZIDE 12.5 MG PO TABS: 12.5000 mg | ORAL_TABLET | Freq: Every day | ORAL | 1 refills | Status: DC

## 2024-02-13 NOTE — Patient Instructions (Signed)
 -  Please get chest X-ray at [facility] imaging at [facility] then will call you with results. ? ?

## 2024-02-14 LAB — CBC WITH DIFFERENTIAL/PLATELET
Absolute Lymphocytes: 2278 {cells}/uL (ref 850–3900)
Absolute Monocytes: 685 {cells}/uL (ref 200–950)
Basophils Absolute: 80 {cells}/uL (ref 0–200)
Basophils Relative: 0.9 %
Eosinophils Absolute: 187 {cells}/uL (ref 15–500)
Eosinophils Relative: 2.1 %
HCT: 44.8 % (ref 35.0–45.0)
Hemoglobin: 15.3 g/dL (ref 11.7–15.5)
MCH: 29.5 pg (ref 27.0–33.0)
MCHC: 34.2 g/dL (ref 32.0–36.0)
MCV: 86.3 fL (ref 80.0–100.0)
MPV: 9.3 fL (ref 7.5–12.5)
Monocytes Relative: 7.7 %
Neutro Abs: 5669 {cells}/uL (ref 1500–7800)
Neutrophils Relative %: 63.7 %
Platelets: 418 10*3/uL — ABNORMAL HIGH (ref 140–400)
RBC: 5.19 10*6/uL — ABNORMAL HIGH (ref 3.80–5.10)
RDW: 13.2 % (ref 11.0–15.0)
Total Lymphocyte: 25.6 %
WBC: 8.9 10*3/uL (ref 3.8–10.8)

## 2024-02-14 LAB — COMPLETE METABOLIC PANEL WITH GFR
AG Ratio: 1.4 (calc) (ref 1.0–2.5)
ALT: 20 U/L (ref 6–29)
AST: 22 U/L (ref 10–35)
Albumin: 4.9 g/dL (ref 3.6–5.1)
Alkaline phosphatase (APISO): 69 U/L (ref 37–153)
BUN: 19 mg/dL (ref 7–25)
CO2: 32 mmol/L (ref 20–32)
Calcium: 10.1 mg/dL (ref 8.6–10.4)
Chloride: 98 mmol/L (ref 98–110)
Creat: 0.85 mg/dL (ref 0.50–1.05)
Globulin: 3.4 g/dL (ref 1.9–3.7)
Glucose, Bld: 97 mg/dL (ref 65–99)
Potassium: 3.6 mmol/L (ref 3.5–5.3)
Sodium: 142 mmol/L (ref 135–146)
Total Bilirubin: 0.9 mg/dL (ref 0.2–1.2)
Total Protein: 8.3 g/dL — ABNORMAL HIGH (ref 6.1–8.1)
eGFR: 74 mL/min/{1.73_m2} (ref >=60)

## 2024-02-14 LAB — LIPID PANEL
Cholesterol: 270 mg/dL — ABNORMAL HIGH (ref <200)
HDL: 45 mg/dL — ABNORMAL LOW (ref >=50)
LDL Cholesterol (Calc): 192 mg/dL — ABNORMAL HIGH
Non-HDL Cholesterol (Calc): 225 mg/dL — ABNORMAL HIGH (ref <130)
Total CHOL/HDL Ratio: 6 (calc) — ABNORMAL HIGH (ref <5.0)
Triglycerides: 167 mg/dL — ABNORMAL HIGH (ref <150)

## 2024-02-14 LAB — HEMOGLOBIN A1C
Hgb A1c MFr Bld: 6 %{Hb} — ABNORMAL HIGH (ref <5.7)
Mean Plasma Glucose: 126 mg/dL
eAG (mmol/L): 7 mmol/L

## 2024-02-14 LAB — TSH: TSH: 2.33 m[IU]/L (ref 0.40–4.50)

## 2024-02-16 ENCOUNTER — Other Ambulatory Visit: Payer: Self-pay

## 2024-02-16 MED ORDER — DOXYCYCLINE HYCLATE 100 MG PO TABS: 100.0000 mg | ORAL_TABLET | Freq: Two times a day (BID) | ORAL | 0 refills | Status: AC

## 2024-02-18 NOTE — Progress Notes (Signed)
   This encounter was created in error - please disregard. No show

## 2024-02-18 NOTE — Progress Notes (Signed)
 Provider: Richarda Blade FNP-C   Zafira Munos, Donalee Citrin, NP  Patient Care Team: Shateria Paternostro, Donalee Citrin, NP as PCP - General (Family Medicine)  Extended Emergency Contact Information [facility] Phone: [phone] Relation: Daughter  Code Status:  Full Code  Goals of care: Advanced Directive information    02/13/2024    1:33 PM  Advanced Directives  Does Patient Have a Medical Advance Directive? No  Would patient like information on creating a medical advance directive? No - Patient declined     Chief Complaint  Patient presents with   Medication Refill    Discuss dexa scan,hepatitis C screening,Medicare Annual Wellness Visit,mammogram,colonoscopy,pneumonia,tdap,shingles,and covid vaccines.    Discussed the use of AI scribe software for clinical note transcription with the patient, who gave verbal consent to proceed.  History of Present Illness   The patient presents for a routine visit and medication refill.  [facility] [place]. She is currently taking hydrochlorothiazide 12.5 mg for blood pressure and inquires if it causes weight gain. She also takes meloxicam for arthritis, which she started due to wrist swelling. She has a history of neck, back, and rotator cuff surgeries and uses a brace for support when needed.  She has a history of sinus tachycardia and an old infarct noted on a previous EKG. No chest pain or shortness of breath, except when exposed to cold weather, which 'takes her breath away'.  She is on duloxetine 30 mg and experiences anxiety, especially when doing things too fast. She manages it with green tea and deep breathing. She experiences dizziness and head spinning when doing things too fast, which she manages by sitting down and drinking green tea.  She has a history of prediabetes with an A1c of 5.8% as of October. She is interested in weight loss and mentions her  clothes fitting better when she was on Ozempic. She used to walk six miles a day but has reduced activity due to cold weather. She inquires about Medicaid coverage for weight loss medications, mentioning past use of Ozempic, which made her feel good and helped with weight loss.  She mentions occasional leg aches at night, which she treats with Tylenol or Bengay.  She reports the recent passing of her baby sister from colon cancer in June, which has been emotionally challenging. Her sister's son, whom she considers like her own, is a Magazine features editor.  She has a supportive family network, helping her daughter with household tasks and caring for her grandchildren. She also assists neighbors and friends in need.    Past Medical History:  Diagnosis Date   Allergy    Arthritis    Hypertension    TIA (transient ischemic attack) 2023   Per new patient form   Past Surgical History:  Procedure Laterality Date   BACK SURGERY     MRI  2023   ER doctor; Per new patient form   ROTATOR CUFF REPAIR     Per new patient form    No Known Allergies  Allergies as of 02/13/2024   No Known Allergies      Medication List        Accurate as of February 13, 2024 11:59 PM. If you have any questions, ask your nurse or doctor.          acetaminophen 650 MG CR tablet Commonly known as: TYLENOL Take 650 mg by mouth every 8 (eight) hours as needed  for pain.   amLODipine 10 MG tablet Commonly known as: NORVASC Take 1 tablet (10 mg total) by mouth daily.   aspirin EC 81 MG tablet Take 1 tablet (81 mg total) by mouth daily. Swallow whole.   DULoxetine 30 MG capsule Commonly known as: CYMBALTA Take 1 capsule (30 mg total) by mouth daily.   hydrochlorothiazide 12.5 MG tablet Commonly known as: HYDRODIURIL Take 1 tablet (12.5 mg total) by mouth daily.   meloxicam 15 MG tablet Commonly known as: MOBIC Take 1 tablet (15 mg total) by mouth daily. Take with food.        Review of Systems   Constitutional:  Negative for appetite change, chills, fatigue, fever and unexpected weight change.  HENT:  Negative for congestion, dental problem, ear discharge, ear pain, facial swelling, hearing loss, nosebleeds, postnasal drip, rhinorrhea, sinus pressure, sinus pain, sneezing, sore throat, tinnitus and trouble swallowing.   Eyes:  Negative for pain, discharge, redness, itching and visual disturbance.  Respiratory:  Negative for cough, chest tightness, shortness of breath and wheezing.   Cardiovascular:  Negative for chest pain, palpitations and leg swelling.  Gastrointestinal:  Negative for abdominal distention, abdominal pain, blood in stool, constipation, diarrhea, nausea and vomiting.  Endocrine: Negative for cold intolerance, heat intolerance, polydipsia, polyphagia and polyuria.  Genitourinary:  Negative for difficulty urinating, dysuria, flank pain, frequency and urgency.  Musculoskeletal:  Positive for arthralgias. Negative for back pain, gait problem, joint swelling, myalgias, neck pain and neck stiffness.  Skin:  Negative for color change, pallor, rash and wound.  Neurological:  Negative for dizziness, syncope, speech difficulty, weakness, light-headedness, numbness and headaches.  Hematological:  Does not bruise/bleed easily.  Psychiatric/Behavioral:  Negative for agitation, behavioral problems, confusion, hallucinations, self-injury, sleep disturbance and suicidal ideas. The patient is nervous/anxious.      There is no immunization history on file for this patient. Pertinent  Health Maintenance Due  Topic Date Due   Colonoscopy  Never done   MAMMOGRAM  Never done   DEXA SCAN  Never done   INFLUENZA VACCINE  03/04/2024 (Originally 07/06/2023)      02/21/2022   10:30 AM 03/18/2022   10:16 AM 06/16/2023    1:25 PM 09/08/2023   10:06 AM 02/13/2024    1:29 PM  Fall Risk  Falls in the past year? 0 0 0 0 0  Was there an injury with Fall? 0 0 0 0 0  Fall Risk Category Calculator  0 0 0 0 0  Fall Risk Category (Retired) Low Low     (RETIRED) Patient Fall Risk Level Low fall risk Low fall risk     Patient at Risk for Falls Due to No Fall Risks No Fall Risks No Fall Risks No Fall Risks No Fall Risks  Fall risk Follow up Falls evaluation completed Falls evaluation completed  Falls evaluation completed Falls evaluation completed   Functional Status Survey:    Vitals:   02/13/24 1334  BP: 138/84  Pulse: 100  Temp: 97.6 F (36.4 C)  SpO2: 97%  Weight: 157 lb 6.4 oz (71.4 kg)  Height: 4\' 10"  (1.473 m)   Body mass index is 32.9 kg/m. Physical Exam  VITALS: T- 97.6, P- 100, BP- 138/84, SaO2- 97% MEASUREMENTS: Weight- 157.4. GENERAL: Alert, cooperative, well developed, no acute distress. HEENT: Normocephalic, normal oropharynx, moist mucous membranes, ears normal without cerumen impaction, nose normal, vision grossly intact. NECK: Neck normal. CHEST: Clear to auscultation bilaterally, no wheezes, rhonchi, or crackles. CARDIOVASCULAR: Tachycardia with heart  rate of 100 bpm, S1 and S2 normal without murmurs. ABDOMEN: Soft, non-tender, non-distended, without organomegaly, normal bowel sounds. EXTREMITIES: No cyanosis or edema. NEUROLOGICAL: Cranial nerves grossly intact, moves all extremities without gross motor or sensory deficit. PSYCHIATRY/BEHAVIORAL: Mood stable   Labs reviewed: Recent Labs    09/08/23 1040 02/13/24 1451  NA 141 142  K 4.1 3.6  CL 104 98  CO2 28 32  GLUCOSE 101* 97  BUN 15 19  CREATININE 0.71 0.85  CALCIUM 9.3 10.1   Recent Labs    09/08/23 1040 02/13/24 1451  AST 17 22  ALT 14 20  BILITOT 0.6 0.9  PROT 7.5 8.3*   Recent Labs    09/08/23 1040 02/13/24 1451  WBC 7.1 8.9  NEUTROABS 4,480 5,669  HGB 14.6 15.3  HCT 43.6 44.8  MCV 89.0 86.3  PLT 374 418*   Lab Results  Component Value Date   TSH 2.33 02/13/2024   Lab Results  Component Value Date   HGBA1C 6.0 (H) 02/13/2024   Lab Results  Component Value Date    CHOL 270 (H) 02/13/2024   HDL 45 (L) 02/13/2024   LDLCALC 192 (H) 02/13/2024   TRIG 167 (H) 02/13/2024   CHOLHDL 6.0 (H) 02/13/2024    Significant Diagnostic Results in last 30 days:  DG Chest 2 View Result Date: 02/14/2024 CLINICAL DATA:  The dyspnea on exertion EXAM: CHEST - 2 VIEW COMPARISON:  None Available. FINDINGS: Subtle ill-defined right basilar infiltrates and hypoventilatory atelectatic changes without consolidations. Correlate clinically to rule out pneumonia pneumonitis. Heart and mediastinum normal. Moderately ectatic descending thoracic aorta Postsurgical changes of the lower cervical spine status post ACDF No pleural effusions. Electronically Signed   By: Shaaron Adler M.D.   On: 02/14/2024 09:42    Assessment/Plan  Sinus Tachycardia Heart rate is elevated at 100 bpm while sitting and 102 bpm on EKG. EKG shows sinus tachycardia with an old infarct and nonspecific ST depression. No chest pain or shortness of breath reported. Further evaluation needed to determine cause of tachycardia. - Refer to cardiologist for further evaluation - Order chest x-ray at [facility] to rule out cardiac or pulmonary causes - Advise her to seek emergency care if experiencing chest pain, shortness of breath, or palpitations  Hypertension Blood pressure is well-controlled with current medication regimen. Current blood pressure reading is 138/84 mmHg. Heart rate is elevated at 100 bpm, which may be due to anxiety or other factors. She is on hydrochlorothiazide and amlodipine for blood pressure management. - Refill hydrochlorothiazide prescription - Continue amlodipine as prescribed - Monitor blood pressure regularly  Prediabetes A1c was 5.8% in October, indicating prediabetes. She is aware of the condition and is interested in weight loss to manage it. Discussed lifestyle modifications including diet and exercise. She has previously used Ozempic with positive results for weight loss, though  it is not currently covered by her insurance for this purpose. - Order repeat A1c test to assess current status - Encourage lifestyle modifications including reducing carbohydrate intake and increasing physical activity - Advise her to check with insurance regarding coverage for weight loss medication  Osteoarthritis Reports wrist swelling and leg aches, especially at night. Meloxicam is used for arthritis management. She has a history of neck, back, and rotator cuff surgeries. Discussed the use of Tylenol and topical analgesics for additional pain relief and the importance of regular exercise to strengthen muscles and reduce joint pain. - Continue meloxicam as prescribed - Use Tylenol or topical analgesics like  Bengay for additional pain relief - Encourage regular exercise to strengthen muscles and reduce joint pain  Generalized Anxiety Disorder Experiences anxiety, especially when doing things too fast. Manages anxiety with duloxetine and relaxation techniques such as deep breathing and green tea. No significant increase in anxiety or depression reported. - Refill duloxetine prescription - Encourage continued use of relaxation techniques   Family/ staff Communication: Reviewed plan of care with patient verbalized understanding   Labs/tests ordered:  - CBC with Differential/Platelet - CMP with eGFR([facility]) - TSH - Hgb A1C - Lipid panel Dg 2 view chest X-ray  EKG   Next Appointment : Return in about 6 months (around 08/15/2024) for medical mangement of chronic issues.Marland Kitchen   Spent 30 minutes of Face to face and non-face to face with patient  >50% time spent counseling; reviewing medical record; tests; labs; documentation and developing future plan of care.   Caesar Bookman, NP

## 2024-02-21 ENCOUNTER — Other Ambulatory Visit: Payer: Self-pay | Admitting: Family

## 2024-02-21 DIAGNOSIS — E6609 Other obesity due to excess calories: Secondary | ICD-10-CM | POA: Insufficient documentation

## 2024-02-21 DIAGNOSIS — E66811 Obesity, class 1: Secondary | ICD-10-CM | POA: Insufficient documentation

## 2024-02-21 MED ORDER — WEGOVY 0.25 MG/0.5ML ~~LOC~~ SOAJ: 0.2500 mg | SUBCUTANEOUS | 3 refills | Status: DC

## 2024-02-21 NOTE — Progress Notes (Signed)
 Wegovy 0.25 mg injection once weekly prescription send to pharmacy.

## 2024-02-22 ENCOUNTER — Telehealth: Payer: Self-pay

## 2024-02-22 ENCOUNTER — Other Ambulatory Visit: Payer: Self-pay | Admitting: Family

## 2024-02-22 DIAGNOSIS — E66811 Obesity, class 1: Secondary | ICD-10-CM

## 2024-02-22 NOTE — Telephone Encounter (Signed)
 Please verify with pharmacy what other options is covered by [employer].

## 2024-02-22 NOTE — Telephone Encounter (Signed)
Pharmacy comment: Alternative Requested:NEEDS A PA.

## 2024-02-22 NOTE — Telephone Encounter (Signed)
 Copied from CRM [id number]. Topic: Clinical - Prescription Issue >> Feb 22, 2024  2:39 PM Hamdi H wrote: Reason for CRM: Pt called in to let us know that she is having an issue getting her prescription filled. The pharmacy told her today that her insurance didn't cover it yet and they're waiting for her provider to sign off on it. Pt would like a call back if there is anything else that she needs to do.  Left message on voicemail for patient to return call when available to over labs. Please Advise  Message sent to Ngetich, Donalee Citrin, NP

## 2024-02-23 NOTE — Telephone Encounter (Signed)
Message routed to PCP Ngetich, Dinah C, NP  

## 2024-02-29 ENCOUNTER — Ambulatory Visit: Payer: Self-pay

## 2024-02-29 NOTE — Telephone Encounter (Signed)
 RN made the first attempt to contact the patient. Patient did not answer, RN LVM with a callback number.

## 2024-02-29 NOTE — Telephone Encounter (Signed)
 Question Symptoms: Cough  Pertinent Negatives: Patient denies fever Disposition: [x]  Follow-up with PCP  Additional Notes: Pt states she finished the 7 days of doxycyline for pneumonia on 3/21. Pt wants to know if she can be around her newborn grandson. Pt has a hacking cough but no other symptoms. This RN will send to office for provider's opinion. This RN told pt she will not hear back until tomorrow. Pt states understanding.  Reason for Disposition . [1] Caller requesting NON-URGENT health information AND [2] PCP's office is the best resource  Answer Assessment - Initial Assessment Questions 1. REASON FOR CALL or QUESTION: "What is your reason for calling today?" or "How can I best help you?" or "What question do you have that I can help answer?"     Question about being around newborn grandson  Protocols used: Information Only Call - No Triage-A-AH

## 2024-03-01 ENCOUNTER — Other Ambulatory Visit: Payer: Self-pay

## 2024-03-01 DIAGNOSIS — E782 Mixed hyperlipidemia: Secondary | ICD-10-CM

## 2024-03-01 MED ORDER — ATORVASTATIN CALCIUM 10 MG PO TABS: 10.0000 mg | ORAL_TABLET | Freq: Every day | ORAL | 1 refills | Status: DC

## 2024-03-01 NOTE — Telephone Encounter (Signed)
Message routed to PCP Ngetich, Dinah C, NP  

## 2024-03-01 NOTE — Telephone Encounter (Signed)
 Patient called back and spoke to me. She wanted to make sure that it was fine for her to be around her son newborn baby. She states that baby is 3 days old. I verbally got the [facility] from [place] that she could be around baby but PCP Ngetich, Dinah C, NP states that she needs to wash her hands, be fever/chill free, and wear a mask for protection.

## 2024-03-01 NOTE — Telephone Encounter (Signed)
 Can be around others in the family if not running any fever or chills.

## 2024-03-01 NOTE — Telephone Encounter (Signed)
Called patient and no answer. Voicemail was left with office call back number.   

## 2024-03-06 ENCOUNTER — Telehealth: Payer: Self-pay

## 2024-03-06 ENCOUNTER — Encounter: Payer: Self-pay | Admitting: Family

## 2024-03-06 ENCOUNTER — Encounter: Admitting: Family

## 2024-03-06 NOTE — Telephone Encounter (Signed)
 Copied from CRM [id number]. Topic: Clinical - Medical Advice >> Mar 06, 2024  9:16 AM Maree Krabbe H wrote: Reason for CRM: Patient is wanting to know if she needs another medication, patient is feeling really fatigued and her ears are hurting, she is not feeling well and doesn't feel as good as she did when taking the antibiotics, patients callback is [phone].  Mychart message sent to patient suggesting an appointment

## 2024-03-06 NOTE — Progress Notes (Signed)
  This encounter was created in error - please disregard. Patient unable to log on video and called multiple times by both provider and CMA but went to voicemail.

## 2024-03-08 ENCOUNTER — Telehealth: Payer: Self-pay

## 2024-03-08 NOTE — Telephone Encounter (Signed)
 Copied from CRM [id number]. Topic: Clinical - Medical Advice >> Mar 08, 2024  1:33 PM Prudencio Pair wrote: Reason for CRM: Patient called to get a follow up on the issue she had a virtual visit with provider on yesterday. She states she was taking Duloxetine that was prescribed to her but she hasn't heard anything else back from the provider. Patient states she is feeling better but has been fatigued and resting, but there has been some pain in shoulder blades. She states her children thinks that she needs to go to the hospital there and get checked. Patient stated she would like to get follow up advice from nurse or Dr. Elam Dutch to see if she maybe just need another round of antibiotics or what. Please give her a call back. CB #: [phone].   Message sent to patient via mychart informing her that she needs an appointment

## 2024-03-08 NOTE — Telephone Encounter (Signed)
 Please schedule visit to evaluate or go to urgent care.

## 2024-06-09 ENCOUNTER — Other Ambulatory Visit: Payer: Self-pay | Admitting: Family

## 2024-06-09 DIAGNOSIS — I10 Essential (primary) hypertension: Secondary | ICD-10-CM

## 2024-08-24 ENCOUNTER — Other Ambulatory Visit: Payer: Self-pay | Admitting: Family

## 2024-09-06 ENCOUNTER — Other Ambulatory Visit: Payer: Self-pay | Admitting: Adult Health

## 2024-09-06 ENCOUNTER — Other Ambulatory Visit: Payer: Self-pay | Admitting: Family

## 2024-09-06 DIAGNOSIS — M545 Low back pain, unspecified: Secondary | ICD-10-CM

## 2024-09-06 DIAGNOSIS — I10 Essential (primary) hypertension: Secondary | ICD-10-CM

## 2024-09-06 NOTE — Telephone Encounter (Signed)
 Pharmacy requested refill.  MyChart sent to patient to schedule an appointment before anymore future refills.

## 2024-10-01 ENCOUNTER — Other Ambulatory Visit: Payer: Self-pay | Admitting: Family

## 2024-10-01 DIAGNOSIS — I10 Essential (primary) hypertension: Secondary | ICD-10-CM

## 2024-12-03 ENCOUNTER — Other Ambulatory Visit: Payer: Self-pay | Admitting: Family

## 2024-12-03 DIAGNOSIS — I10 Essential (primary) hypertension: Secondary | ICD-10-CM

## 2024-12-03 DIAGNOSIS — G8929 Other chronic pain: Secondary | ICD-10-CM

## 2024-12-03 NOTE — Telephone Encounter (Signed)
 Copied from CRM #[id number]. Topic: Clinical - Medication Refill >> Dec 03, 2024 11:01 AM Suzette B wrote: Medication:  meloxicam  (MOBIC ) 15 MG tablet hydrochlorothiazide  (HYDRODIURIL ) 12.5 MG tablet amLODipine  (NORVASC ) 10 MG tablet DULoxetine  (CYMBALTA ) 30 MG capsule  Has the patient contacted their pharmacy? Yes requested the pt call the office  to request the refill    This is the patient's preferred pharmacy:   [facility] [address] [place], [place] [facility] [address] [facility] [address] [place] [address] [place] [address] Phone: [phone] Fax: [phone]  Is this the correct pharmacy for this prescription? Yes If no, delete pharmacy and type the correct one.   Has the prescription been filled recently? Yes  Is the patient out of the medication? She is out of the Meloxicam  for about 2 weeks and I hurting really bad.   Has the patient been seen for an appointment in the last year OR does the patient have an upcoming appointment? Yes  Can we respond through MyChart? Yes  Agent: Please be advised that Rx refills may take up to 3 business days. We ask that you follow-up with your pharmacy.

## 2024-12-04 ENCOUNTER — Other Ambulatory Visit: Payer: Self-pay | Admitting: Family

## 2024-12-04 DIAGNOSIS — I10 Essential (primary) hypertension: Secondary | ICD-10-CM

## 2024-12-04 NOTE — Telephone Encounter (Signed)
 Refill medication x 1 month supply will need to schedule appointment prior to next medication refill.

## 2024-12-04 NOTE — Telephone Encounter (Signed)
 Pharmacy requested refill.  Patient is overdue for an appointment and 2 messages have been sent to patient to schedule an appointment.  Pended Rx and sent to [facility] for Approval/Denial.

## 2024-12-27 ENCOUNTER — Other Ambulatory Visit: Payer: Self-pay | Admitting: Family

## 2024-12-27 DIAGNOSIS — M545 Low back pain, unspecified: Secondary | ICD-10-CM

## 2025-01-03 ENCOUNTER — Other Ambulatory Visit: Payer: Self-pay | Admitting: Family

## 2025-01-03 DIAGNOSIS — I10 Essential (primary) hypertension: Secondary | ICD-10-CM
